# Patient Record
Sex: Male | Born: 1955
Health system: Southern US, Community
[De-identification: ages and names within clinical notes are randomized; demographics above are authoritative.]

## PROBLEM LIST (undated history)

## (undated) DIAGNOSIS — F32A Depression, unspecified: Secondary | ICD-10-CM

## (undated) DIAGNOSIS — I10 Essential (primary) hypertension: Secondary | ICD-10-CM

## (undated) DIAGNOSIS — F329 Major depressive disorder, single episode, unspecified: Secondary | ICD-10-CM

## (undated) HISTORY — DX: Depression, unspecified: F32.A

## (undated) HISTORY — DX: Essential (primary) hypertension: I10

## (undated) HISTORY — PX: TONSILLECTOMY: SUR1361

## (undated) HISTORY — DX: Major depressive disorder, single episode, unspecified: F32.9

---

## 2004-02-29 ENCOUNTER — Emergency Department: Payer: Self-pay | Admitting: Emergency Medicine

## 2004-02-29 ENCOUNTER — Other Ambulatory Visit: Payer: Self-pay

## 2005-03-06 ENCOUNTER — Ambulatory Visit: Payer: Self-pay | Admitting: Family Medicine

## 2005-03-08 ENCOUNTER — Ambulatory Visit: Payer: Self-pay | Admitting: Family Medicine

## 2006-04-08 ENCOUNTER — Ambulatory Visit: Payer: Self-pay | Admitting: Family Medicine

## 2006-05-08 ENCOUNTER — Ambulatory Visit: Payer: Self-pay | Admitting: Gastroenterology

## 2006-10-07 IMAGING — CT CT CERVICAL SPINE WITHOUT CONTRAST
1 of 2 series · 9 of 14 positions shown, 12 images · non-contrast
Comparison: none

REASON FOR EXAM: Eval Occult Fx
COMMENTS:

[Series 3: inspace · axial · 0.39mm/px · z∈[-229,-68]mm · 9 of 288 slices shown, 12 images]
[im 29/288  soft-tissue]
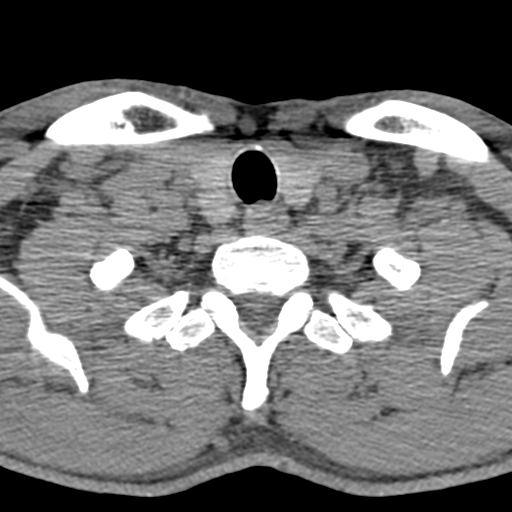
[im 29/288  bone]
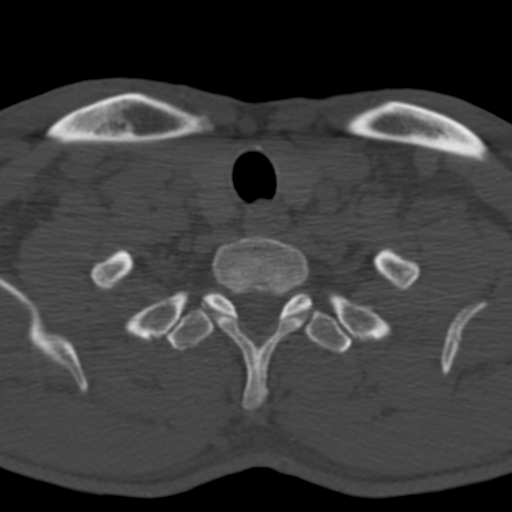
[im 58/288  bone]
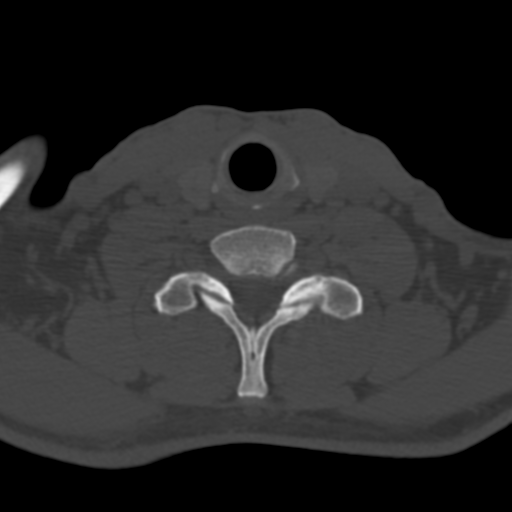
[im 87/288  bone]
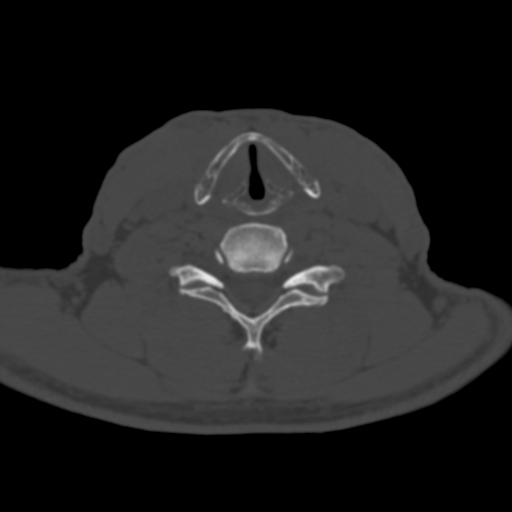
[im 115/288  bone]
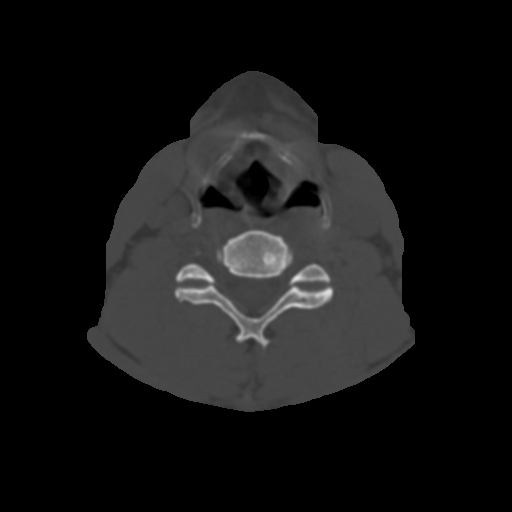
[im 144/288  soft-tissue]
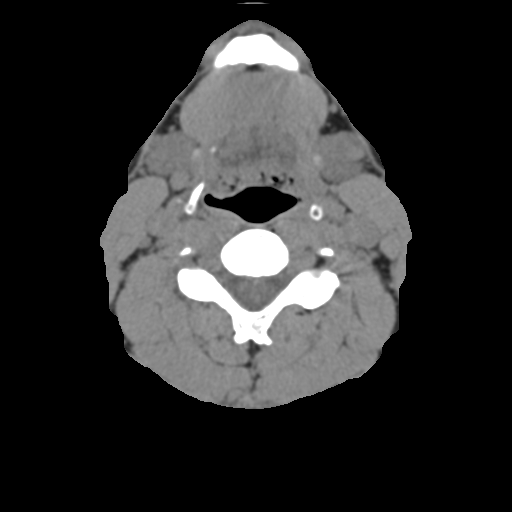
[im 144/288  bone]
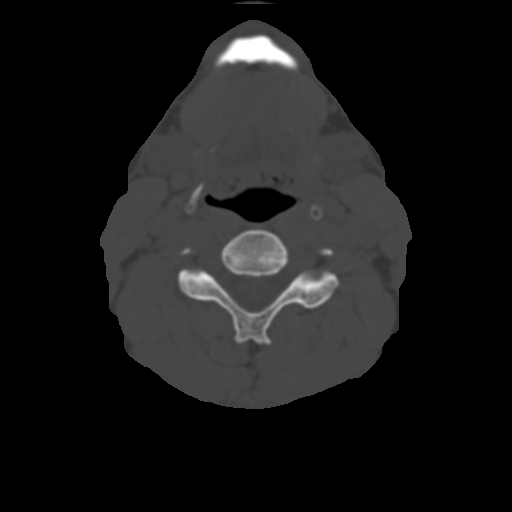
[im 173/288  bone]
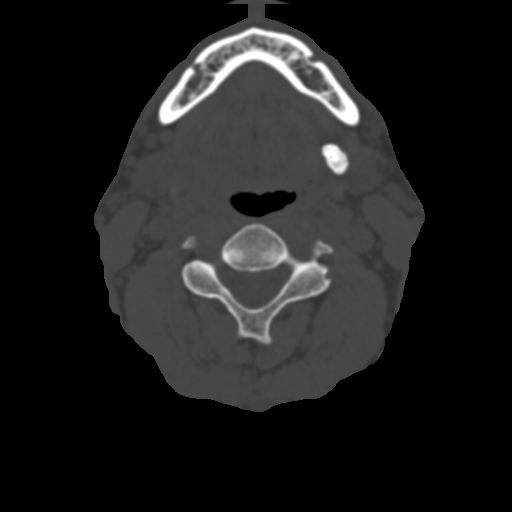
[im 201/288  bone]
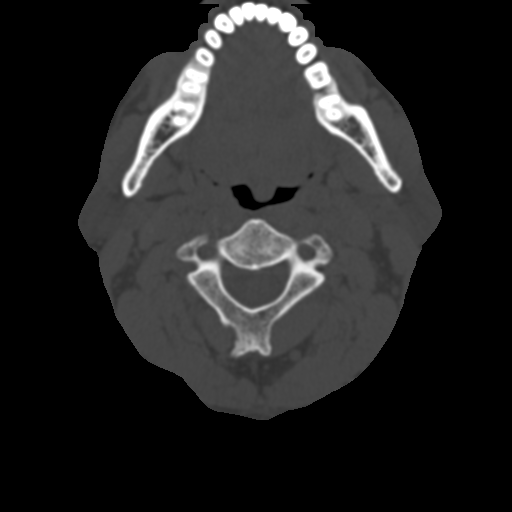
[im 230/288  bone]
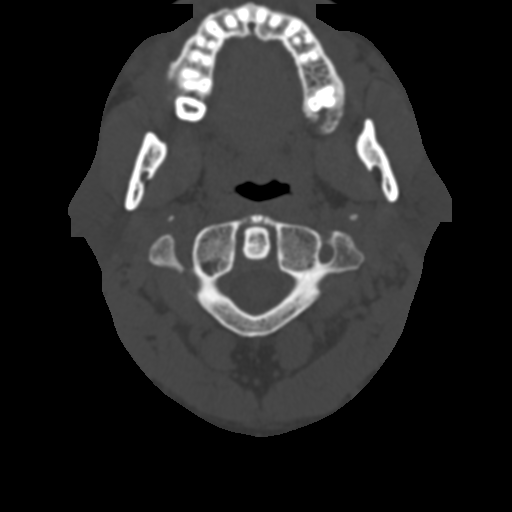
[im 259/288  soft-tissue]
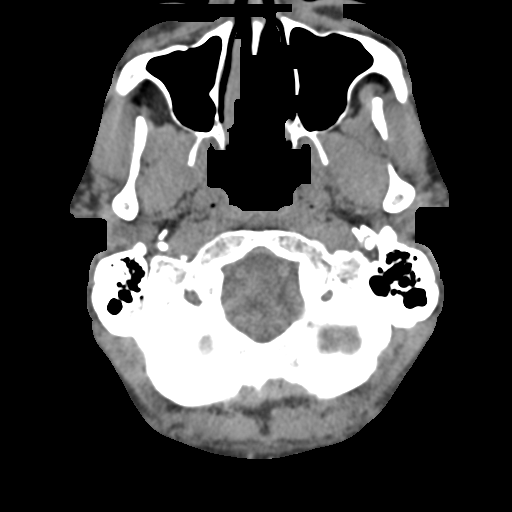
[im 259/288  bone]
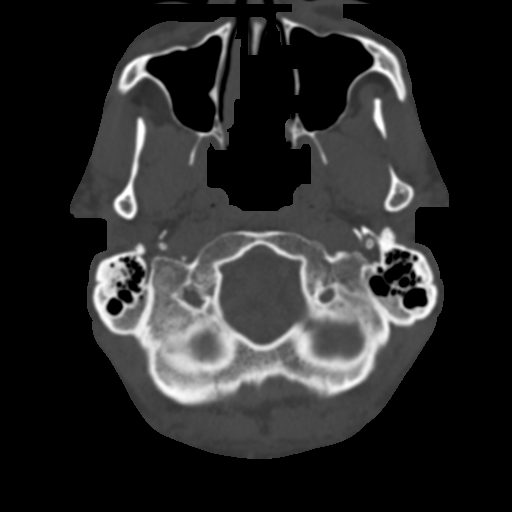

[9 of 14 positions shown; findings below may reference images not displayed]

PROCEDURE:     CT  - CT CERVICAL SPINE WO  - March 08, 2005  [DATE]

RESULT:     Axial, coronal and sagittal reconstructions were performed. No
fracture is identified. There are some degenerative changes present. There
is no evidence of subluxation.  There is no widening of the facets. Should
the patient's symptoms persist it may be reasonable to consider the patient
for MRI.  The prevertebral soft tissues appear to be normal.  No loss of
height is seen in the vertebral body.  The cervicobasilar relationships
appear to be maintained. The C1-C2 alignment is normal and the odontoid is
intact.
IMPRESSION: 1)No CT evidence of an occult cervical spine fracture.

## 2012-04-23 HISTORY — PX: COLONOSCOPY: SHX174

## 2012-12-18 ENCOUNTER — Ambulatory Visit: Payer: Self-pay | Admitting: Gastroenterology

## 2013-06-25 ENCOUNTER — Emergency Department: Payer: Self-pay | Admitting: Emergency Medicine

## 2014-10-04 ENCOUNTER — Other Ambulatory Visit: Payer: Self-pay | Admitting: Family Medicine

## 2014-10-04 DIAGNOSIS — F32A Depression, unspecified: Secondary | ICD-10-CM

## 2014-10-04 DIAGNOSIS — F329 Major depressive disorder, single episode, unspecified: Secondary | ICD-10-CM

## 2014-11-02 ENCOUNTER — Ambulatory Visit (INDEPENDENT_AMBULATORY_CARE_PROVIDER_SITE_OTHER): Payer: Managed Care, Other (non HMO) | Admitting: Family Medicine

## 2014-11-02 ENCOUNTER — Encounter: Payer: Self-pay | Admitting: Family Medicine

## 2014-11-02 VITALS — BP 130/70 | HR 64 | Ht 70.0 in | Wt 170.0 lb

## 2014-11-02 DIAGNOSIS — E785 Hyperlipidemia, unspecified: Secondary | ICD-10-CM

## 2014-11-02 DIAGNOSIS — F32A Depression, unspecified: Secondary | ICD-10-CM

## 2014-11-02 DIAGNOSIS — I10 Essential (primary) hypertension: Secondary | ICD-10-CM | POA: Diagnosis not present

## 2014-11-02 DIAGNOSIS — F329 Major depressive disorder, single episode, unspecified: Secondary | ICD-10-CM | POA: Diagnosis not present

## 2014-11-02 MED ORDER — SERTRALINE HCL 50 MG PO TABS: 50.0000 mg | ORAL_TABLET | Freq: Every day | ORAL | Status: DC

## 2014-11-02 MED ORDER — LISINOPRIL-HYDROCHLOROTHIAZIDE 10-12.5 MG PO TABS: 1.0000 | ORAL_TABLET | Freq: Every day | ORAL | Status: DC

## 2014-11-02 NOTE — Progress Notes (Signed)
Name: Jordan Morton   MRN: [id number]    DOB: 05/01/1955   Date:11/02/2014       Progress Note  Subjective  Chief Complaint  Chief Complaint  Patient presents with  . Hypertension  . Depression    Hypertension This is a chronic problem. The current episode started more than 1 year ago. The problem has been gradually improving since onset. Pertinent negatives include no anxiety, blurred vision, chest pain, headaches, malaise/fatigue, neck pain, orthopnea, palpitations, peripheral edema, PND, shortness of breath or sweats. There are no associated agents to hypertension. Risk factors for coronary artery disease include male gender and smoking/tobacco exposure. Past treatments include ACE inhibitors and diuretics. The current treatment provides moderate improvement. There are no compliance problems.  There is no history of angina, kidney disease, CAD/MI, CVA, heart failure, left ventricular hypertrophy, PVD or retinopathy. There is no history of chronic renal disease.  Mental Health Problem The primary symptoms include dysphoric mood. The primary symptoms do not include delusions, hallucinations, bizarre behavior, disorganized speech, negative symptoms or somatic symptoms. The current episode started more than 1 month ago.  The mood has been improving since its onset. He characterizes the problem as mild. The mood includes feelings of sadness.  The degree of incapacity that he is experiencing as a consequence of his illness is mild. Additional symptoms of the illness include appetite change. Additional symptoms of the illness do not include no anhedonia, no insomnia, no hypersomnia, no unexpected weight change, no feelings of worthlessness, no euphoric mood, no decreased need for sleep, no poor judgment, no headaches or no abdominal pain. He does not admit to suicidal ideas.    No problem-specific assessment & plan notes found for this encounter.   Past Medical History  Diagnosis Date  .  Hypertension   . Depression     Past Surgical History  Procedure Laterality Date  . Colonoscopy  2014    repeat in 5 years  . Tonsillectomy      Family History  Problem Relation Age of Onset  . Cancer Mother   . Cancer Father     History   Social History  . Marital Status: Married    Spouse Name: N/A  . Number of Children: N/A  . Years of Education: N/A   Occupational History  . Not on file.   Social History Main Topics  . Smoking status: Current Every Day Smoker  . Smokeless tobacco: Not on file  . Alcohol Use: 0.0 oz/week    0 Standard drinks or equivalent per week  . Drug Use: No  . Sexual Activity: Yes   Other Topics Concern  . Not on file   Social History Narrative  . No narrative on file    No Known Allergies   Review of Systems  Constitutional: Positive for appetite change. Negative for fever, chills, weight loss, malaise/fatigue and unexpected weight change.  HENT: Negative for ear discharge, ear pain and sore throat.   Eyes: Negative for blurred vision.  Respiratory: Negative for cough, sputum production, shortness of breath and wheezing.   Cardiovascular: Negative for chest pain, palpitations, orthopnea, leg swelling and PND.  Gastrointestinal: Negative for heartburn, nausea, abdominal pain, diarrhea, constipation, blood in stool and melena.  Genitourinary: Negative for dysuria, urgency, frequency and hematuria.  Musculoskeletal: Negative for myalgias, back pain, joint pain and neck pain.  Skin: Negative for rash.  Neurological: Negative for dizziness, tingling, sensory change, focal weakness and headaches.  Endo/Heme/Allergies: Negative for environmental allergies and  polydipsia. Does not bruise/bleed easily.  Psychiatric/Behavioral: Positive for dysphoric mood. Negative for depression, suicidal ideas and hallucinations. The patient is not nervous/anxious and does not have insomnia.      Objective  Filed Vitals:   11/02/14 0809  BP: 130/70   Pulse: 64  Height: 5\' 10"  (1.778 m)  Weight: 170 lb (77.111 kg)    Physical Exam  Constitutional: He is oriented to person, place, and time and well-developed, well-nourished, and in no distress.  HENT:  Head: Normocephalic.  Right Ear: External ear normal.  Left Ear: External ear normal.  Nose: Nose normal.  Mouth/Throat: Oropharynx is clear and moist.  Eyes: Conjunctivae and EOM are normal. Pupils are equal, round, and reactive to light. Right eye exhibits no discharge. Left eye exhibits no discharge. No scleral icterus.  Neck: Normal range of motion. Neck supple. No JVD present. No tracheal deviation present. No thyromegaly present.  Cardiovascular: Normal rate, regular rhythm, normal heart sounds and intact distal pulses.  Exam reveals no gallop and no friction rub.   No murmur heard. Pulmonary/Chest: Breath sounds normal. No respiratory distress. He has no wheezes. He has no rales.  Abdominal: Soft. Bowel sounds are normal. He exhibits no mass. There is no hepatosplenomegaly. There is no tenderness. There is no rebound, no guarding and no CVA tenderness.  Musculoskeletal: Normal range of motion. He exhibits no edema or tenderness.  Lymphadenopathy:    He has no cervical adenopathy.  Neurological: He is alert and oriented to person, place, and time. He has normal sensation, normal strength, normal reflexes and intact cranial nerves. No cranial nerve deficit.  Skin: Skin is warm. No rash noted.  Psychiatric: Mood and affect normal.  Nursing note and vitals reviewed.     Assessment & Plan  Problem List Items Addressed This Visit    None    Visit Diagnoses    Essential hypertension    -  Primary    Relevant Medications    aspirin EC 81 MG tablet    lisinopril-hydrochlorothiazide (PRINZIDE,ZESTORETIC) 10-12.5 MG per tablet    Other Relevant Orders    Renal Function Panel    Depression        Relevant Medications    sertraline (ZOLOFT) 50 MG tablet    Dyslipidemia         Relevant Orders    Lipid Profile         Dr. Otilio Miu [facility]  11/02/2014

## 2014-11-03 LAB — LIPID PANEL
CHOL/HDL RATIO: 2.7 ratio (ref 0.0–5.0)
CHOLESTEROL TOTAL: 165 mg/dL (ref 100–199)
HDL: 61 mg/dL (ref >39)
LDL Calculated: 90 mg/dL (ref 0–99)
Triglycerides: 69 mg/dL (ref 0–149)
VLDL Cholesterol Cal: 14 mg/dL (ref 5–40)

## 2014-11-03 LAB — RENAL FUNCTION PANEL: Phosphorus: 2.7 mg/dL (ref 2.5–4.5)

## 2015-02-14 ENCOUNTER — Ambulatory Visit (INDEPENDENT_AMBULATORY_CARE_PROVIDER_SITE_OTHER): Payer: Managed Care, Other (non HMO) | Admitting: Family Medicine

## 2015-02-14 ENCOUNTER — Encounter: Payer: Self-pay | Admitting: Family Medicine

## 2015-02-14 VITALS — BP 130/80 | HR 80 | Ht 70.0 in | Wt 172.0 lb

## 2015-02-14 DIAGNOSIS — J0110 Acute frontal sinusitis, unspecified: Secondary | ICD-10-CM | POA: Diagnosis not present

## 2015-02-14 MED ORDER — AMOXICILLIN 500 MG PO CAPS: 500.0000 mg | ORAL_CAPSULE | Freq: Three times a day (TID) | ORAL | Status: DC

## 2015-02-14 NOTE — Progress Notes (Signed)
Name: Jordan Morton   MRN: [id number]    DOB: Aug 30, 1955   Date:02/14/2015       Progress Note  Subjective  Chief Complaint  Chief Complaint  Patient presents with  . Sinusitis    tried otc sinus med    Sinusitis This is a new problem. The current episode started in the past 7 days. The problem has been gradually worsening since onset. There has been no fever. His pain is at a severity of 4/10. Associated symptoms include chills, congestion, diaphoresis, headaches, sinus pressure, a sore throat and swollen glands. Pertinent negatives include no coughing, ear pain, neck pain or shortness of breath. Past treatments include oral decongestants. The treatment provided no relief.    No problem-specific assessment & plan notes found for this encounter.   Past Medical History  Diagnosis Date  . Hypertension   . Depression     Past Surgical History  Procedure Laterality Date  . Colonoscopy  2014    repeat in 5 years  . Tonsillectomy      Family History  Problem Relation Age of Onset  . Cancer Mother   . Cancer Father     Social History   Social History  . Marital Status: Married    Spouse Name: N/A  . Number of Children: N/A  . Years of Education: N/A   Occupational History  . Not on file.   Social History Main Topics  . Smoking status: Former Research scientist (life sciences)  . Smokeless tobacco: Current User    Types: Snuff  . Alcohol Use: 0.0 oz/week    0 Standard drinks or equivalent per week  . Drug Use: No  . Sexual Activity: Yes   Other Topics Concern  . Not on file   Social History Narrative    No Known Allergies   Review of Systems  Constitutional: Positive for chills and diaphoresis. Negative for fever, weight loss and malaise/fatigue.  HENT: Positive for congestion, sinus pressure and sore throat. Negative for ear discharge, ear pain, hearing loss, nosebleeds and tinnitus.   Eyes: Negative for blurred vision.  Respiratory: Negative for cough, sputum production,  shortness of breath and wheezing.   Cardiovascular: Negative for chest pain, palpitations and leg swelling.  Gastrointestinal: Negative for heartburn, nausea, abdominal pain, diarrhea, constipation, blood in stool and melena.  Genitourinary: Negative for dysuria, urgency, frequency and hematuria.  Musculoskeletal: Negative for myalgias, back pain, joint pain and neck pain.  Skin: Negative for rash.  Neurological: Positive for headaches. Negative for dizziness, tingling, sensory change and focal weakness.  Endo/Heme/Allergies: Negative for environmental allergies and polydipsia. Does not bruise/bleed easily.  Psychiatric/Behavioral: Negative for depression and suicidal ideas. The patient is not nervous/anxious and does not have insomnia.      Objective  Filed Vitals:   02/14/15 0749  BP: 130/80  Pulse: 80  Height: 5\' 10"  (1.778 m)  Weight: 172 lb (78.019 kg)    Physical Exam  Constitutional: He is oriented to person, place, and time and well-developed, well-nourished, and in no distress.  HENT:  Head: Normocephalic.  Right Ear: External ear normal. A foreign body is present.  Left Ear: External ear and ear canal normal. A middle ear effusion is present.  Nose: Nose normal.  Mouth/Throat: Oropharynx is clear and moist.  Eyes: Conjunctivae and EOM are normal. Pupils are equal, round, and reactive to light. Right eye exhibits no discharge. Left eye exhibits no discharge. No scleral icterus.  Neck: Normal range of motion. Neck supple. No JVD  present. No tracheal deviation present. No thyromegaly present.  Cardiovascular: Normal rate, regular rhythm, normal heart sounds and intact distal pulses.  Exam reveals no gallop and no friction rub.   No murmur heard. Pulmonary/Chest: Breath sounds normal. No respiratory distress. He has no wheezes. He has no rales.  Abdominal: Soft. Bowel sounds are normal. He exhibits no mass. There is no hepatosplenomegaly. There is no tenderness. There is no  rebound, no guarding and no CVA tenderness.  Musculoskeletal: Normal range of motion. He exhibits no edema or tenderness.  Lymphadenopathy:    He has no cervical adenopathy.  Neurological: He is alert and oriented to person, place, and time. He has normal sensation, normal strength, normal reflexes and intact cranial nerves. No cranial nerve deficit.  Skin: Skin is warm. No rash noted.  Psychiatric: Mood and affect normal.      Assessment & Plan  Problem List Items Addressed This Visit    None    Visit Diagnoses    Acute frontal sinusitis, recurrence not specified    -  Primary         Dr. Otilio Miu [facility]  02/14/2015

## 2015-07-21 ENCOUNTER — Other Ambulatory Visit: Payer: Self-pay

## 2015-07-21 DIAGNOSIS — Z716 Tobacco abuse counseling: Secondary | ICD-10-CM

## 2015-07-21 MED ORDER — VARENICLINE TARTRATE 0.5 MG PO TABS: 0.5000 mg | ORAL_TABLET | Freq: Two times a day (BID) | ORAL | Status: DC

## 2015-08-03 ENCOUNTER — Other Ambulatory Visit: Payer: Self-pay | Admitting: Family Medicine

## 2015-08-08 ENCOUNTER — Ambulatory Visit (INDEPENDENT_AMBULATORY_CARE_PROVIDER_SITE_OTHER): Payer: Managed Care, Other (non HMO) | Admitting: Family Medicine

## 2015-08-08 ENCOUNTER — Encounter: Payer: Self-pay | Admitting: Family Medicine

## 2015-08-08 VITALS — BP 122/80 | HR 88 | Ht 70.0 in | Wt 185.0 lb

## 2015-08-08 DIAGNOSIS — F329 Major depressive disorder, single episode, unspecified: Secondary | ICD-10-CM | POA: Diagnosis not present

## 2015-08-08 DIAGNOSIS — I10 Essential (primary) hypertension: Secondary | ICD-10-CM | POA: Diagnosis not present

## 2015-08-08 DIAGNOSIS — F32A Depression, unspecified: Secondary | ICD-10-CM

## 2015-08-08 MED ORDER — SERTRALINE HCL 50 MG PO TABS: ORAL_TABLET | ORAL | Status: DC

## 2015-08-08 MED ORDER — LISINOPRIL-HYDROCHLOROTHIAZIDE 10-12.5 MG PO TABS: 1.0000 | ORAL_TABLET | Freq: Every day | ORAL | Status: DC

## 2015-08-08 NOTE — Progress Notes (Signed)
Name: Jordan Morton   MRN: [id number]    DOB: 1955/10/05   Date:08/08/2015       Progress Note  Subjective  Chief Complaint  Chief Complaint  Patient presents with  . Hypertension  . Depression    Hypertension This is a chronic problem. The current episode started more than 1 year ago. The problem has been gradually improving since onset. The problem is controlled. Pertinent negatives include no anxiety, blurred vision, chest pain, headaches, malaise/fatigue, neck pain, orthopnea, palpitations, peripheral edema, PND, shortness of breath or sweats. There are no associated agents to hypertension. Risk factors for coronary artery disease include dyslipidemia, smoking/tobacco exposure and post-menopausal state. Past treatments include ACE inhibitors and diuretics. The current treatment provides moderate improvement. There are no compliance problems.  There is no history of angina, kidney disease, CAD/MI, CVA, heart failure, left ventricular hypertrophy, PVD, renovascular disease or retinopathy. There is no history of chronic renal disease or a hypertension causing med.  Depression        This is a chronic problem.  The current episode started more than 1 year ago.   The onset quality is gradual.   The problem occurs intermittently.  The problem has been gradually improving since onset.  Associated symptoms include does not have insomnia, no myalgias, no headaches and no suicidal ideas.     The symptoms are aggravated by nothing.  Past treatments include SSRIs - Selective serotonin reuptake inhibitors.  Compliance with treatment is good.  Previous treatment provided mild relief.   Pertinent negatives include no chronic fatigue syndrome, no terminal illness, no anxiety and no eating disorder.   No problem-specific assessment & plan notes found for this encounter.   Past Medical History  Diagnosis Date  . Hypertension   . Depression     Past Surgical History  Procedure Laterality Date  .  Colonoscopy  2014    repeat in 5 years  . Tonsillectomy      Family History  Problem Relation Age of Onset  . Cancer Mother   . Cancer Father     Social History   Social History  . Marital Status: Married    Spouse Name: N/A  . Number of Children: N/A  . Years of Education: N/A   Occupational History  . Not on file.   Social History Main Topics  . Smoking status: Former Research scientist (life sciences)  . Smokeless tobacco: Current User    Types: Snuff  . Alcohol Use: 0.0 oz/week    0 Standard drinks or equivalent per week  . Drug Use: No  . Sexual Activity: Yes   Other Topics Concern  . Not on file   Social History Narrative    No Known Allergies   Review of Systems  Constitutional: Negative for fever, chills, weight loss and malaise/fatigue.  HENT: Negative for ear discharge, ear pain and sore throat.   Eyes: Negative for blurred vision.  Respiratory: Negative for cough, sputum production, shortness of breath and wheezing.   Cardiovascular: Negative for chest pain, palpitations, orthopnea, leg swelling and PND.  Gastrointestinal: Negative for heartburn, nausea, abdominal pain, diarrhea, constipation, blood in stool and melena.  Genitourinary: Negative for dysuria, urgency, frequency and hematuria.  Musculoskeletal: Negative for myalgias, back pain, joint pain and neck pain.  Skin: Negative for rash.  Neurological: Negative for dizziness, tingling, sensory change, focal weakness and headaches.  Endo/Heme/Allergies: Negative for environmental allergies and polydipsia. Does not bruise/bleed easily.  Psychiatric/Behavioral: Positive for depression. Negative for suicidal ideas. The  patient is not nervous/anxious and does not have insomnia.      Objective  Filed Vitals:   08/08/15 0853  BP: 122/80  Pulse: 88  Height: 5\' 10"  (1.778 m)  Weight: 185 lb (83.915 kg)    Physical Exam  Constitutional: He is oriented to person, place, and time and well-developed, well-nourished, and in  no distress.  HENT:  Head: Normocephalic.  Right Ear: External ear normal.  Left Ear: External ear normal.  Nose: Nose normal.  Mouth/Throat: Oropharynx is clear and moist.  Eyes: Conjunctivae and EOM are normal. Pupils are equal, round, and reactive to light. Right eye exhibits no discharge. Left eye exhibits no discharge. No scleral icterus.  Neck: Normal range of motion. Neck supple. No JVD present. No tracheal deviation present. No thyromegaly present.  Cardiovascular: Normal rate, regular rhythm, normal heart sounds and intact distal pulses.  Exam reveals no gallop and no friction rub.   No murmur heard. Pulmonary/Chest: Breath sounds normal. No respiratory distress. He has no wheezes. He has no rales.  Abdominal: Soft. Bowel sounds are normal. He exhibits no mass. There is no hepatosplenomegaly. There is no tenderness. There is no rebound, no guarding and no CVA tenderness.  Musculoskeletal: Normal range of motion. He exhibits no edema or tenderness.  Lymphadenopathy:    He has no cervical adenopathy.  Neurological: He is alert and oriented to person, place, and time. He has normal sensation, normal strength, normal reflexes and intact cranial nerves. No cranial nerve deficit.  Skin: Skin is warm. No rash noted.  Psychiatric: Mood and affect normal.  Nursing note and vitals reviewed.     Assessment & Plan  Problem List Items Addressed This Visit    None    Visit Diagnoses    Essential hypertension    -  Primary    Relevant Medications    lisinopril-hydrochlorothiazide (PRINZIDE,ZESTORETIC) 10-12.5 MG tablet    Other Relevant Orders    Renal function panel    Depression        Relevant Medications    sertraline (ZOLOFT) 50 MG tablet         Dr. Maximo Spratling [facility]  08/08/2015

## 2015-08-09 LAB — RENAL FUNCTION PANEL
Albumin: 5 g/dL (ref 3.5–5.5)
BUN / CREAT RATIO: 14 (ref 9–20)
BUN: 11 mg/dL (ref 6–24)
CALCIUM: 9.8 mg/dL (ref 8.7–10.2)
CHLORIDE: 96 mmol/L (ref 96–106)
CO2: 26 mmol/L (ref 18–29)
Creatinine, Ser: 0.8 mg/dL (ref 0.76–1.27)
GFR calc Af Amer: 114 mL/min/{1.73_m2} (ref >59)
GFR calc non Af Amer: 98 mL/min/{1.73_m2} (ref >59)
Glucose: 89 mg/dL (ref 65–99)
PHOSPHORUS: 3.1 mg/dL (ref 2.5–4.5)
Potassium: 4.9 mmol/L (ref 3.5–5.2)
Sodium: 139 mmol/L (ref 134–144)

## 2015-08-23 ENCOUNTER — Other Ambulatory Visit: Payer: Self-pay | Admitting: Family Medicine

## 2015-12-03 ENCOUNTER — Other Ambulatory Visit: Payer: Self-pay | Admitting: Family Medicine

## 2016-05-17 ENCOUNTER — Other Ambulatory Visit: Payer: Self-pay

## 2016-05-17 DIAGNOSIS — F331 Major depressive disorder, recurrent, moderate: Secondary | ICD-10-CM

## 2016-05-17 DIAGNOSIS — I10 Essential (primary) hypertension: Secondary | ICD-10-CM

## 2016-05-17 MED ORDER — LISINOPRIL-HYDROCHLOROTHIAZIDE 10-12.5 MG PO TABS: 1.0000 | ORAL_TABLET | Freq: Every day | ORAL | 0 refills | Status: DC

## 2016-05-17 MED ORDER — SERTRALINE HCL 50 MG PO TABS: ORAL_TABLET | ORAL | 0 refills | Status: DC

## 2016-05-22 ENCOUNTER — Ambulatory Visit (INDEPENDENT_AMBULATORY_CARE_PROVIDER_SITE_OTHER): Payer: Commercial Managed Care - PPO | Admitting: Family Medicine

## 2016-05-22 ENCOUNTER — Encounter: Payer: Self-pay | Admitting: Family Medicine

## 2016-05-22 VITALS — BP 110/80 | HR 80 | Ht 70.0 in | Wt 172.0 lb

## 2016-05-22 DIAGNOSIS — I10 Essential (primary) hypertension: Secondary | ICD-10-CM

## 2016-05-22 DIAGNOSIS — N411 Chronic prostatitis: Secondary | ICD-10-CM

## 2016-05-22 DIAGNOSIS — E785 Hyperlipidemia, unspecified: Secondary | ICD-10-CM | POA: Diagnosis not present

## 2016-05-22 DIAGNOSIS — F320 Major depressive disorder, single episode, mild: Secondary | ICD-10-CM | POA: Diagnosis not present

## 2016-05-22 DIAGNOSIS — F331 Major depressive disorder, recurrent, moderate: Secondary | ICD-10-CM

## 2016-05-22 DIAGNOSIS — R351 Nocturia: Secondary | ICD-10-CM

## 2016-05-22 DIAGNOSIS — Z0000 Encounter for general adult medical examination without abnormal findings: Secondary | ICD-10-CM

## 2016-05-22 LAB — HEMOCCULT GUIAC POC 1CARD (OFFICE): FECAL OCCULT BLD: NEGATIVE

## 2016-05-22 MED ORDER — SULFAMETHOXAZOLE-TRIMETHOPRIM 800-160 MG PO TABS: 1.0000 | ORAL_TABLET | Freq: Two times a day (BID) | ORAL | 0 refills | Status: DC

## 2016-05-22 MED ORDER — LISINOPRIL-HYDROCHLOROTHIAZIDE 10-12.5 MG PO TABS: 1.0000 | ORAL_TABLET | Freq: Every day | ORAL | 1 refills | Status: DC

## 2016-05-22 MED ORDER — SERTRALINE HCL 50 MG PO TABS: ORAL_TABLET | ORAL | 1 refills | Status: DC
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
## 2016-05-23 LAB — RENAL FUNCTION PANEL
ALBUMIN: 4.8 g/dL (ref 3.5–5.5)
BUN/Creatinine Ratio: 12 (ref 9–20)
BUN: 9 mg/dL (ref 6–24)
CALCIUM: 9.8 mg/dL (ref 8.7–10.2)
CO2: 28 mmol/L (ref 18–29)
CREATININE: 0.76 mg/dL (ref 0.76–1.27)
Chloride: 95 mmol/L — ABNORMAL LOW (ref 96–106)
GFR calc Af Amer: 115 mL/min/{1.73_m2} (ref >59)
GFR calc non Af Amer: 100 mL/min/{1.73_m2} (ref >59)
GLUCOSE: 91 mg/dL (ref 65–99)
PHOSPHORUS: 3.2 mg/dL (ref 2.5–4.5)
POTASSIUM: 4.6 mmol/L (ref 3.5–5.2)
SODIUM: 137 mmol/L (ref 134–144)

## 2016-05-23 LAB — LIPID PANEL
CHOL/HDL RATIO: 3.3 ratio (ref 0.0–5.0)
Cholesterol, Total: 195 mg/dL (ref 100–199)
HDL: 60 mg/dL (ref >39)
LDL Calculated: 122 mg/dL — ABNORMAL HIGH (ref 0–99)
TRIGLYCERIDES: 66 mg/dL (ref 0–149)
VLDL Cholesterol Cal: 13 mg/dL (ref 5–40)

## 2016-05-23 LAB — PSA: PROSTATE SPECIFIC AG, SERUM: 1.3 ng/mL (ref 0.0–4.0)

## 2016-06-19 ENCOUNTER — Ambulatory Visit: Payer: Managed Care, Other (non HMO) | Admitting: Family Medicine

## 2016-08-21 ENCOUNTER — Other Ambulatory Visit: Payer: Self-pay | Admitting: Family Medicine

## 2016-08-23 ENCOUNTER — Other Ambulatory Visit: Payer: Self-pay | Admitting: Family Medicine

## 2016-10-27 ENCOUNTER — Other Ambulatory Visit: Payer: Self-pay | Admitting: Family Medicine

## 2017-02-13 ENCOUNTER — Other Ambulatory Visit: Payer: Self-pay | Admitting: Family Medicine

## 2017-02-13 DIAGNOSIS — I10 Essential (primary) hypertension: Secondary | ICD-10-CM

## 2017-02-13 DIAGNOSIS — F331 Major depressive disorder, recurrent, moderate: Secondary | ICD-10-CM

## 2017-04-19 ENCOUNTER — Other Ambulatory Visit: Payer: Self-pay

## 2017-08-17 ENCOUNTER — Other Ambulatory Visit: Payer: Self-pay | Admitting: Family Medicine

## 2017-08-17 DIAGNOSIS — I10 Essential (primary) hypertension: Secondary | ICD-10-CM

## 2017-08-17 DIAGNOSIS — F331 Major depressive disorder, recurrent, moderate: Secondary | ICD-10-CM

## 2018-02-17 ENCOUNTER — Other Ambulatory Visit: Payer: Self-pay | Admitting: Family Medicine

## 2018-02-17 DIAGNOSIS — F331 Major depressive disorder, recurrent, moderate: Secondary | ICD-10-CM

## 2018-02-17 DIAGNOSIS — I10 Essential (primary) hypertension: Secondary | ICD-10-CM

## 2018-03-15 ENCOUNTER — Other Ambulatory Visit: Payer: Self-pay | Admitting: Family Medicine

## 2018-03-15 DIAGNOSIS — I10 Essential (primary) hypertension: Secondary | ICD-10-CM

## 2018-03-15 DIAGNOSIS — F331 Major depressive disorder, recurrent, moderate: Secondary | ICD-10-CM

## 2018-03-16 ENCOUNTER — Other Ambulatory Visit: Payer: Self-pay | Admitting: Family Medicine

## 2018-03-16 DIAGNOSIS — F331 Major depressive disorder, recurrent, moderate: Secondary | ICD-10-CM

## 2018-04-02 ENCOUNTER — Ambulatory Visit: Payer: Commercial Managed Care - PPO | Admitting: Family Medicine

## 2018-04-02 ENCOUNTER — Telehealth: Payer: Self-pay

## 2018-04-02 ENCOUNTER — Encounter: Payer: Self-pay | Admitting: Family Medicine

## 2018-04-02 VITALS — BP 140/62 | HR 80 | Ht 70.0 in | Wt 175.0 lb

## 2018-04-02 DIAGNOSIS — F331 Major depressive disorder, recurrent, moderate: Secondary | ICD-10-CM | POA: Diagnosis not present

## 2018-04-02 DIAGNOSIS — R351 Nocturia: Secondary | ICD-10-CM

## 2018-04-02 DIAGNOSIS — I10 Essential (primary) hypertension: Secondary | ICD-10-CM | POA: Diagnosis not present

## 2018-04-02 DIAGNOSIS — E785 Hyperlipidemia, unspecified: Secondary | ICD-10-CM | POA: Diagnosis not present

## 2018-04-02 DIAGNOSIS — F17200 Nicotine dependence, unspecified, uncomplicated: Secondary | ICD-10-CM | POA: Diagnosis not present

## 2018-04-02 DIAGNOSIS — Z1211 Encounter for screening for malignant neoplasm of colon: Secondary | ICD-10-CM

## 2018-04-02 MED ORDER — SERTRALINE HCL 50 MG PO TABS: 50.0000 mg | ORAL_TABLET | Freq: Every day | ORAL | 5 refills | Status: DC

## 2018-04-02 MED ORDER — LISINOPRIL-HYDROCHLOROTHIAZIDE 10-12.5 MG PO TABS: 1.0000 | ORAL_TABLET | Freq: Every day | ORAL | 5 refills | Status: DC

## 2018-04-02 MED ORDER — NICOTINE 21 MG/24HR TD PT24: 21.0000 mg | MEDICATED_PATCH | Freq: Every day | TRANSDERMAL | 0 refills | Status: DC

## 2018-04-02 NOTE — Progress Notes (Signed)
Date:  04/02/2018   Name:  Jordan Morton   DOB:  02-11-1956   MRN:  [id number]   Chief Complaint: Hypertension; Depression; and Colonoscopy (Dr Allen Norris- 10 years)  Hypertension  This is a chronic problem. The current episode started more than 1 year ago. The problem is unchanged. The problem is controlled. Pertinent negatives include no anxiety, blurred vision, chest pain, headaches, malaise/fatigue, neck pain, orthopnea, palpitations, peripheral edema, PND, shortness of breath or sweats. There are no associated agents to hypertension. There are no known risk factors for coronary artery disease. Past treatments include ACE inhibitors and diuretics. The current treatment provides mild improvement. There are no compliance problems.  There is no history of angina, kidney disease, CAD/MI, CVA, heart failure, left ventricular hypertrophy, PVD or retinopathy. There is no history of chronic renal disease, a hypertension causing med or renovascular disease.  Depression         This is a chronic problem.  The current episode started more than 1 year ago.   The onset quality is sudden.   The problem has been gradually improving since onset.  Associated symptoms include no decreased concentration, no fatigue, no helplessness, no hopelessness, does not have insomnia, not irritable, no restlessness, no decreased interest, no appetite change, no body aches, no myalgias, no headaches, no indigestion, not sad and no suicidal ideas.     The symptoms are aggravated by work stress (improved stress).  Past treatments include SSRIs - Selective serotonin reuptake inhibitors.  Compliance with treatment is good.  Past compliance problems include difficulty understanding directions.  Previous treatment provided no relief relief.   Pertinent negatives include no hypothyroidism and no anxiety. Hyperlipidemia  This is a chronic problem. The current episode started more than 1 year ago. The problem is controlled. Recent lipid  tests were reviewed and are normal. He has no history of chronic renal disease, diabetes, hypothyroidism or obesity. There are no known factors aggravating his hyperlipidemia. Pertinent negatives include no chest pain, myalgias or shortness of breath. Current antihyperlipidemic treatment includes diet change. The current treatment provides mild improvement of lipids. There are no compliance problems.  Risk factors for coronary artery disease include dyslipidemia and male sex.    Review of Systems  Constitutional: Negative for appetite change, chills, fatigue, fever and malaise/fatigue.  HENT: Negative for drooling, ear discharge, ear pain and sore throat.   Eyes: Negative for blurred vision.  Respiratory: Negative for cough, shortness of breath and wheezing.   Cardiovascular: Negative for chest pain, palpitations, orthopnea, leg swelling and PND.  Gastrointestinal: Negative for abdominal pain, blood in stool, constipation, diarrhea and nausea.  Endocrine: Negative for polydipsia.  Genitourinary: Negative for dysuria, frequency, hematuria and urgency.  Musculoskeletal: Negative for back pain, myalgias and neck pain.  Skin: Negative for rash.  Allergic/Immunologic: Negative for environmental allergies.  Neurological: Negative for dizziness and headaches.  Hematological: Does not bruise/bleed easily.  Psychiatric/Behavioral: Positive for depression. Negative for decreased concentration and suicidal ideas. The patient is not nervous/anxious and does not have insomnia.     Patient Active Problem List   Diagnosis Date Noted  . Essential hypertension 05/22/2016  . Depression, major, single episode, mild ([place]) 05/22/2016  . Hyperlipidemia 05/22/2016  . Nocturia 05/22/2016    No Known Allergies  Past Surgical History:  Procedure Laterality Date  . COLONOSCOPY  2014   repeat in 5 years  . TONSILLECTOMY      Social History   Tobacco Use  . Smoking status:  Current Every Day Smoker     Types: Cigarettes  . Smokeless tobacco: Current User    Types: Snuff  . Tobacco comment: wants patches  Substance Use Topics  . Alcohol use: Yes    Alcohol/week: 0.0 standard drinks  . Drug use: No     Medication list has been reviewed and updated.  Current Meds  Medication Sig  . aspirin EC 81 MG tablet Take 1 tablet by mouth daily.  Marland Kitchen lisinopril-hydrochlorothiazide (PRINZIDE,ZESTORETIC) 10-12.5 MG tablet TAKE 1 TABLET BY MOUTH EVERY DAY  . sertraline (ZOLOFT) 50 MG tablet TAKE 1 TABLET BY MOUTH EVERY DAY  . [DISCONTINUED] Multiple Vitamins-Minerals (MULTIVITAL PO) Take 1 tablet by mouth daily.    PHQ 2/9 Scores 04/02/2018  PHQ - 2 Score 0  PHQ- 9 Score 0    Physical Exam  Constitutional: He is oriented to person, place, and time. He is not irritable.  HENT:  Head: Normocephalic.  Right Ear: External ear normal.  Left Ear: External ear normal.  Nose: Nose normal.  Mouth/Throat: Oropharynx is clear and moist.  Eyes: Pupils are equal, round, and reactive to light. Conjunctivae and EOM are normal. Right eye exhibits no discharge. Left eye exhibits no discharge. No scleral icterus.  Neck: Normal range of motion. Neck supple. No JVD present. No tracheal deviation present. No thyromegaly present.  Cardiovascular: Normal rate, regular rhythm, S1 normal, S2 normal, normal heart sounds and intact distal pulses. Exam reveals no gallop, no S3, no S4 and no friction rub.  No murmur heard.  No systolic murmur is present.  No diastolic murmur is present. Pulmonary/Chest: Breath sounds normal. No respiratory distress. He has no wheezes. He has no rales.  Abdominal: Soft. Bowel sounds are normal. He exhibits no mass. There is no hepatosplenomegaly. There is no tenderness. There is no rebound, no guarding and no CVA tenderness.  Genitourinary: Rectum normal, prostate normal and penis normal. Rectal exam shows no mass and guaiac negative stool. Right testis shows no mass, no swelling and no  tenderness. Left testis shows no mass, no swelling and no tenderness.  Genitourinary Comments: Right hydrocoel/transilluminates  Musculoskeletal: Normal range of motion. He exhibits no edema or tenderness.  Lymphadenopathy:    He has no cervical adenopathy.  Neurological: He is alert and oriented to person, place, and time. He has normal strength and normal reflexes. No cranial nerve deficit.  Skin: Skin is warm. No rash noted.  Nursing note and vitals reviewed.   BP 140/62   Pulse 80   Ht 5\' 10"  (1.778 m)   Wt 175 lb (79.4 kg)   BMI 25.11 kg/m   Assessment and Plan: 1. Tobacco dependency Patient has been advised of the health risks of smoking and counseled concerning cessation of tobacco products. I spent over 3 minutes for discussion and to answer questions.  NicoDerm patches 21 mg called in. - nicotine (NICODERM CQ - DOSED IN MG/24 HOURS) 21 mg/24hr patch; Place 1 patch (21 mg total) onto the skin daily.  Dispense: 28 patch; Refill: 0  2. Essential hypertension Chronic.  Stable.  Continue lisinopril hydrochlorothiazide 10-12 0.5 daily.  Will check renal function panel. - lisinopril-hydrochlorothiazide (PRINZIDE,ZESTORETIC) 10-12.5 MG tablet; Take 1 tablet by mouth daily.  Dispense: 30 tablet; Refill: 5 - Renal Function Panel  3. Moderate episode of recurrent major depressive disorder (HCC) Chronic.  Under moderate stress.  Continue sertraline 50 mg daily. - sertraline (ZOLOFT) 50 MG tablet; Take 1 tablet (50 mg total) by mouth daily.  Dispense:  30 tablet; Refill: 5  4. Hyperlipidemia, unspecified hyperlipidemia type Current.  Low-cholesterol diet reemphasized.  Check lipid panel. - Lipid panel  5. Nocturia Patient has occasional episodes of nocturia.  Check PSA - PSA  6. Colon cancer screening Guaiac negative.  Referral to gastroenterology for scheduling of colon cancer screening wiyh colonoscopy. - Ambulatory referral to Gastroenterology     Dr. Otilio Miu [facility]  04/02/2018

## 2018-04-02 NOTE — Patient Instructions (Signed)
Coping with Quitting Smoking Quitting smoking is a physical and mental challenge. You will face cravings, withdrawal symptoms, and temptation. Before quitting, work with your health care provider to make a plan that can help you cope. Preparation can help you quit and keep you from giving in. How can I cope with cravings? Cravings usually last for 5-10 minutes. If you get through it, the craving will pass. Consider taking the following actions to help you cope with cravings:  Keep your mouth busy: ? Chew sugar-free gum. ? Suck on hard candies or a straw. ? Brush your teeth.  Keep your hands and body busy: ? Immediately change to a different activity when you feel a craving. ? Squeeze or play with a ball. ? Do an activity or a hobby, like making bead jewelry, practicing needlepoint, or working with wood. ? Mix up your normal routine. ? Take a short exercise break. Go for a quick walk or run up and down stairs. ? Spend time in public places where smoking is not allowed.  Focus on doing something kind or helpful for someone else.  Call a friend or family member to talk during a craving.  Join a support group.  Call a quit line, such as 1-800-QUIT-NOW.  Talk with your health care provider about medicines that might help you cope with cravings and make quitting easier for you.  How can I deal with withdrawal symptoms? Your body may experience negative effects as it tries to get used to not having nicotine in the system. These effects are called withdrawal symptoms. They may include:  Feeling hungrier than normal.  Trouble concentrating.  Irritability.  Trouble sleeping.  Feeling depressed.  Restlessness and agitation.  Craving a cigarette.  To manage withdrawal symptoms:  Avoid places, people, and activities that trigger your cravings.  Remember why you want to quit.  Get plenty of sleep.  Avoid coffee and other caffeinated drinks. These may worsen some of your  symptoms.  How can I handle social situations? Social situations can be difficult when you are quitting smoking, especially in the first few weeks. To manage this, you can:  Avoid parties, bars, and other social situations where people might be smoking.  Avoid alcohol.  Leave right away if you have the urge to smoke.  Explain to your family and friends that you are quitting smoking. Ask for understanding and support.  Plan activities with friends or family where smoking is not an option.  What are some ways I can cope with stress? Wanting to smoke may cause stress, and stress can make you want to smoke. Find ways to manage your stress. Relaxation techniques can help. For example:  Breathe slowly and deeply, in through your nose and out through your mouth.  Listen to soothing, relaxing music.  Talk with a family member or friend about your stress.  Light a candle.  Soak in a bath or take a shower.  Think about a peaceful place.  What are some ways I can prevent weight gain? Be aware that many people gain weight after they quit smoking. However, not everyone does. To keep from gaining weight, have a plan in place before you quit and stick to the plan after you quit. Your plan should include:  Having healthy snacks. When you have a craving, it may help to: ? Eat plain popcorn, crunchy carrots, celery, or other cut vegetables. ? Chew sugar-free gum.  Changing how you eat: ? Eat small portion sizes at meals. ?   Eat 4-6 small meals throughout the day instead of 1-2 large meals a day. ? Be mindful when you eat. Do not watch television or do other things that might distract you as you eat.  Exercising regularly: ? Make time to exercise each day. If you do not have time for a long workout, do short bouts of exercise for 5-10 minutes several times a day. ? Do some form of strengthening exercise, like weight lifting, and some form of aerobic exercise, like running or  swimming.  Drinking plenty of water or other low-calorie or no-calorie drinks. Drink 6-8 glasses of water daily, or as much as instructed by your health care provider.  Summary  Quitting smoking is a physical and mental challenge. You will face cravings, withdrawal symptoms, and temptation to smoke again. Preparation can help you as you go through these challenges.  You can cope with cravings by keeping your mouth busy (such as by chewing gum), keeping your body and hands busy, and making calls to family, friends, or a helpline for people who want to quit smoking.  You can cope with withdrawal symptoms by avoiding places where people smoke, avoiding drinks with caffeine, and getting plenty of rest.  Ask your health care provider about the different ways to prevent weight gain, avoid stress, and handle social situations. This information is not intended to replace advice given to you by your health care provider. Make sure you discuss any questions you have with your health care provider. Document Released: 04/06/2016 Document Revised: 04/06/2016 Document Reviewed: 04/06/2016 Elsevier Interactive Patient Education  2018 [employer]vier Inc.  

## 2018-04-02 NOTE — Telephone Encounter (Signed)
Pt is calling to schedule colon test. Referral in place

## 2018-04-03 ENCOUNTER — Other Ambulatory Visit: Payer: Self-pay

## 2018-04-03 DIAGNOSIS — Z86010 Personal history of colonic polyps: Secondary | ICD-10-CM

## 2018-04-03 LAB — LIPID PANEL
Chol/HDL Ratio: 3.1 ratio (ref 0.0–5.0)
Cholesterol, Total: 177 mg/dL (ref 100–199)
HDL: 57 mg/dL (ref >39)
LDL Calculated: 110 mg/dL — ABNORMAL HIGH (ref 0–99)
TRIGLYCERIDES: 52 mg/dL (ref 0–149)
VLDL Cholesterol Cal: 10 mg/dL (ref 5–40)

## 2018-04-03 LAB — RENAL FUNCTION PANEL
Albumin: 4.6 g/dL (ref 3.6–4.8)
BUN/Creatinine Ratio: 17 (ref 10–24)
BUN: 15 mg/dL (ref 8–27)
CO2: 25 mmol/L (ref 20–29)
Calcium: 9.6 mg/dL (ref 8.6–10.2)
Chloride: 101 mmol/L (ref 96–106)
Creatinine, Ser: 0.88 mg/dL (ref 0.76–1.27)
GFR calc Af Amer: 108 mL/min/{1.73_m2} (ref >59)
GFR calc non Af Amer: 93 mL/min/{1.73_m2} (ref >59)
Glucose: 95 mg/dL (ref 65–99)
Phosphorus: 3.1 mg/dL (ref 2.5–4.5)
Potassium: 4.7 mmol/L (ref 3.5–5.2)
SODIUM: 140 mmol/L (ref 134–144)

## 2018-04-03 LAB — PSA: Prostate Specific Ag, Serum: 1.5 ng/mL (ref 0.0–4.0)

## 2018-04-03 MED ORDER — NA SULFATE-K SULFATE-MG SULF 17.5-3.13-1.6 GM/177ML PO SOLN: 1.0000 | Freq: Once | ORAL | 0 refills | Status: AC

## 2018-04-03 NOTE — Telephone Encounter (Signed)
Colonoscopy has been scheduled for 04/08/18 with Dr. Vicente Males at [facility].  Instructions emailed to him at: [email or web address]. Prescription sent to [facility] in [place].  Thanks [employer]

## 2018-04-03 NOTE — Telephone Encounter (Signed)
Pt wife is calling she added pt cell number for you to call pt on his cell to schedule apt cell# [phone]

## 2018-04-04 ENCOUNTER — Encounter: Payer: Self-pay | Admitting: Gastroenterology

## 2018-04-04 ENCOUNTER — Telehealth: Payer: Self-pay | Admitting: Gastroenterology

## 2018-04-04 NOTE — Telephone Encounter (Signed)
Received a fax regarding pt procedure 04/08/18 it has been approved auth# [id number]

## 2018-04-07 ENCOUNTER — Encounter: Payer: Self-pay | Admitting: Student

## 2018-04-08 ENCOUNTER — Ambulatory Visit
Admission: RE | Admit: 2018-04-08 | Discharge: 2018-04-08 | Disposition: A | Discharge status: Home / Self Care | Payer: Commercial Managed Care - PPO | Attending: Gastroenterology | Admitting: Gastroenterology

## 2018-04-08 ENCOUNTER — Encounter: Payer: Self-pay | Admitting: *Deleted

## 2018-04-08 ENCOUNTER — Encounter
Admission: RE | Disposition: A | Discharge status: Home / Self Care | Payer: Self-pay | Source: Home / Self Care | Attending: Gastroenterology

## 2018-04-08 ENCOUNTER — Ambulatory Visit: Payer: Commercial Managed Care - PPO | Admitting: Anesthesiology

## 2018-04-08 DIAGNOSIS — F329 Major depressive disorder, single episode, unspecified: Secondary | ICD-10-CM | POA: Insufficient documentation

## 2018-04-08 DIAGNOSIS — F17210 Nicotine dependence, cigarettes, uncomplicated: Secondary | ICD-10-CM | POA: Diagnosis not present

## 2018-04-08 DIAGNOSIS — I10 Essential (primary) hypertension: Secondary | ICD-10-CM | POA: Insufficient documentation

## 2018-04-08 DIAGNOSIS — K5730 Diverticulosis of large intestine without perforation or abscess without bleeding: Secondary | ICD-10-CM | POA: Insufficient documentation

## 2018-04-08 DIAGNOSIS — D120 Benign neoplasm of cecum: Secondary | ICD-10-CM

## 2018-04-08 DIAGNOSIS — Z1211 Encounter for screening for malignant neoplasm of colon: Secondary | ICD-10-CM | POA: Insufficient documentation

## 2018-04-08 DIAGNOSIS — Z79899 Other long term (current) drug therapy: Secondary | ICD-10-CM | POA: Insufficient documentation

## 2018-04-08 DIAGNOSIS — K635 Polyp of colon: Secondary | ICD-10-CM | POA: Diagnosis not present

## 2018-04-08 DIAGNOSIS — K640 First degree hemorrhoids: Secondary | ICD-10-CM | POA: Diagnosis not present

## 2018-04-08 DIAGNOSIS — Z7982 Long term (current) use of aspirin: Secondary | ICD-10-CM | POA: Insufficient documentation

## 2018-04-08 DIAGNOSIS — Z86010 Personal history of colonic polyps: Secondary | ICD-10-CM | POA: Insufficient documentation

## 2018-04-08 HISTORY — PX: COLONOSCOPY WITH PROPOFOL: SHX5780

## 2018-04-08 SURGERY — COLONOSCOPY WITH PROPOFOL
Anesthesia: General

## 2018-04-08 MED ORDER — PROPOFOL 10 MG/ML IV BOLUS
INTRAVENOUS | Status: DC | PRN
  Administered 2018-04-08: 80 mg via INTRAVENOUS
  Administered 2018-04-08: 20 mg via INTRAVENOUS

## 2018-04-08 MED ORDER — PROPOFOL 500 MG/50ML IV EMUL
INTRAVENOUS | Status: AC
  Filled 2018-04-08: qty 50

## 2018-04-08 MED ORDER — LIDOCAINE HCL (PF) 2 % IJ SOLN
INTRAMUSCULAR | Status: AC
  Filled 2018-04-08: qty 10

## 2018-04-08 MED ORDER — PROPOFOL 500 MG/50ML IV EMUL
INTRAVENOUS | Status: DC | PRN
  Administered 2018-04-08: 200 ug/kg/min via INTRAVENOUS

## 2018-04-08 MED ORDER — LIDOCAINE 2% (20 MG/ML) 5 ML SYRINGE
INTRAMUSCULAR | Status: DC | PRN
  Administered 2018-04-08: 50 mg via INTRAVENOUS

## 2018-04-08 MED ORDER — SODIUM CHLORIDE 0.9 % IV SOLN
INTRAVENOUS | Status: DC
  Administered 2018-04-08: 1000 mL via INTRAVENOUS

## 2018-04-08 NOTE — Anesthesia Post-op Follow-up Note (Signed)
Anesthesia QCDR form completed.        

## 2018-04-08 NOTE — Op Note (Signed)
[facility] Patient Name: Jordan Morton Procedure Date: 04/08/2018 9:14 AM MRN: [id number] Account #: [id number] Date of Birth: 18-May-1955 Admit Type: Outpatient Age: 62 Room: [facility] ENDO ROOM 1 Gender: Male Note Status: Finalized Procedure:            Colonoscopy Indications:          High risk colon cancer surveillance: Personal history                        of colonic polyps Providers:            Jonathon Bellows MD, MD Referring MD:         Juline Patch, MD (Referring MD) Medicines:            Monitored Anesthesia Care Complications:        No immediate complications. Procedure:            Pre-Anesthesia Assessment:                       - Prior to the procedure, a History and Physical was                        performed, and patient medications, allergies and                        sensitivities were reviewed. The patient's tolerance of                        previous anesthesia was reviewed.                       - The risks and benefits of the procedure and the                        sedation options and risks were discussed with the                        patient. All questions were answered and informed                        consent was obtained.                       - ASA Grade Assessment: II - A patient with mild                        systemic disease.                       After obtaining informed consent, the colonoscope was                        passed under direct vision. Throughout the procedure,                        the patient's blood pressure, pulse, and oxygen                        saturations were monitored continuously. The  Colonoscope was introduced through the anus and                        advanced to the the cecum, identified by the                        appendiceal orifice, IC valve and transillumination.                        The colonoscopy was performed with ease. The patient                  tolerated the procedure well. The quality of the bowel                        preparation was good. Findings:      The perianal and digital rectal examinations were normal.      Non-bleeding internal hemorrhoids were found during retroflexion. The       hemorrhoids were medium-sized and Grade I (internal hemorrhoids that do       not prolapse).      Multiple small-mouthed diverticula were found in the sigmoid colon.      A 3 mm polyp was found in the cecum. The polyp was sessile. The polyp       was removed with a cold biopsy forceps. Resection and retrieval were       complete.      The exam was otherwise without abnormality on direct and retroflexion       views. Impression:           - Non-bleeding internal hemorrhoids.                       - Diverticulosis in the sigmoid colon.                       - One 3 mm polyp in the cecum, removed with a cold                        biopsy forceps. Resected and retrieved.                       - The examination was otherwise normal on direct and                        retroflexion views. Recommendation:       - Discharge patient to home (with escort).                       - Resume previous diet.                       - Continue present medications.                       - Await pathology results.                       - Repeat colonoscopy in 5 years for surveillance. Procedure Code(s):    --- Professional ---                       [phone], Colonoscopy, flexible; with biopsy, single  or                        multiple Diagnosis Code(s):    --- Professional ---                       Z86.010, Personal history of colonic polyps                       K64.0, First degree hemorrhoids                       D12.0, Benign neoplasm of cecum                       K57.30, Diverticulosis of large intestine without                        perforation or abscess without bleeding CPT copyright 2018 American Medical Association. All rights  reserved. The codes documented in this report are preliminary and upon coder review may  be revised to meet current compliance requirements. Jonathon Bellows, MD Jonathon Bellows MD, MD 04/08/2018 9:48:22 AM This report has been signed electronically. Number of Addenda: 0 Note Initiated On: 04/08/2018 9:14 AM Scope Withdrawal Time: 0 hours 13 minutes 33 seconds  Total Procedure Duration: 0 hours 17 minutes 33 seconds       [facility]

## 2018-04-08 NOTE — Transfer of Care (Signed)
Immediate Anesthesia Transfer of Care Note  Patient: Jordan Morton  Procedure(s) Performed: COLONOSCOPY WITH PROPOFOL (N/A )  Patient Location: Endoscopy Unit  Anesthesia Type:General  Level of Consciousness: awake, alert  and oriented  Airway & Oxygen Therapy: Patient connected to nasal cannula oxygen  Post-op Assessment: Post -op Vital signs reviewed and stable  Post vital signs: stable  Last Vitals:  Vitals Value Taken Time  BP 111/81 04/08/2018  9:51 AM  Temp 36.1 C 04/08/2018  9:51 AM  Pulse 98 04/08/2018  9:51 AM  Resp 20 04/08/2018  9:51 AM  SpO2 98 % 04/08/2018  9:51 AM    Last Pain:  Vitals:   04/08/18 0951  TempSrc: Tympanic  PainSc: 0-No pain         Complications: No apparent anesthesia complications

## 2018-04-08 NOTE — Anesthesia Preprocedure Evaluation (Signed)
Anesthesia Evaluation  Patient identified by MRN, date of birth, ID band Patient awake    Reviewed: Allergy & Precautions, H&P , NPO status , Patient's Chart, lab work & pertinent test results, reviewed documented beta blocker date and time   Airway Mallampati: II   Neck ROM: full    Dental  (+) Poor Dentition   Pulmonary neg pulmonary ROS, Current Smoker,    Pulmonary exam normal        Cardiovascular Exercise Tolerance: Good hypertension, On Medications negative cardio ROS Normal cardiovascular exam Rhythm:regular Rate:Normal     Neuro/Psych negative neurological ROS  negative psych ROS   GI/Hepatic negative GI ROS, Neg liver ROS,   Endo/Other  negative endocrine ROS  Renal/GU negative Renal ROS  negative genitourinary   Musculoskeletal   Abdominal   Peds  Hematology negative hematology ROS (+)   Anesthesia Other Findings Past Medical History: No date: Depression No date: Hypertension Past Surgical History: 2014: COLONOSCOPY     Comment:  repeat in 5 years No date: TONSILLECTOMY   Reproductive/Obstetrics negative OB ROS                             Anesthesia Physical Anesthesia Plan  ASA: II  Anesthesia Plan: General   Post-op Pain Management:    Induction:   PONV Risk Score and Plan:   Airway Management Planned:   Additional Equipment:   Intra-op Plan:   Post-operative Plan:   Informed Consent: I have reviewed the patients History and Physical, chart, labs and discussed the procedure including the risks, benefits and alternatives for the proposed anesthesia with the patient or authorized representative who has indicated his/her understanding and acceptance.   Dental Advisory Given  Plan Discussed with: CRNA  Anesthesia Plan Comments:         Anesthesia Quick Evaluation

## 2018-04-08 NOTE — H&P (Signed)
Jordan Bellows, MD [address], [place], [place], [place], [address] [address], [place], [place], [place], [address] Phone: [phone]  Fax: [phone]  Primary Care Physician:  Juline Patch, MD   Pre-Procedure History & Physical: HPI:  Jordan Morton is a 62 y.o. male is here for an colonoscopy.   Past Medical History:  Diagnosis Date  . Depression   . Hypertension     Past Surgical History:  Procedure Laterality Date  . COLONOSCOPY  2014   repeat in 5 years  . TONSILLECTOMY      Prior to Admission medications   Medication Sig Start Date End Date Taking? Authorizing Provider  aspirin EC 81 MG tablet Take 1 tablet by mouth daily.   Yes [provider]  lisinopril-hydrochlorothiazide (PRINZIDE,ZESTORETIC) 10-12.5 MG tablet Take 1 tablet by mouth daily. 04/02/18  Yes Juline Patch, MD  sertraline (ZOLOFT) 50 MG tablet Take 1 tablet (50 mg total) by mouth daily. 04/02/18  Yes Juline Patch, MD  nicotine (NICODERM CQ - DOSED IN MG/24 HOURS) 21 mg/24hr patch Place 1 patch (21 mg total) onto the skin daily. 04/02/18   Juline Patch, MD    Allergies as of 04/03/2018  . (No Known Allergies)    Family History  Problem Relation Age of Onset  . Cancer Mother   . Cancer Father     Social History   Socioeconomic History  . Marital status: Married    Spouse name: Not on file  . Number of children: Not on file  . Years of education: Not on file  . Highest education level: Not on file  Occupational History  . Not on file  Social Needs  . Financial resource strain: Not on file  . Food insecurity:    Worry: Not on file    Inability: Not on file  . Transportation needs:    Medical: Not on file    Non-medical: Not on file  Tobacco Use  . Smoking status: Current Every Day Smoker    Types: Cigarettes  . Smokeless tobacco: Current User    Types: Snuff  . Tobacco comment: wants patches  Substance and Sexual Activity  . Alcohol use: Yes   Alcohol/week: 0.0 standard drinks  . Drug use: No  . Sexual activity: Yes  Lifestyle  . Physical activity:    Days per week: Not on file    Minutes per session: Not on file  . Stress: Not on file  Relationships  . Social connections:    Talks on phone: Not on file    Gets together: Not on file    Attends religious service: Not on file    Active member of club or organization: Not on file    Attends meetings of clubs or organizations: Not on file    Relationship status: Not on file  . Intimate partner violence:    Fear of current or ex partner: Not on file    Emotionally abused: Not on file    Physically abused: Not on file    Forced sexual activity: Not on file  Other Topics Concern  . Not on file  Social History Narrative  . Not on file    Review of Systems: See HPI, otherwise negative ROS  Physical Exam: BP 129/89   Pulse 96   Temp (!) 97 F (36.1 C) (Tympanic)   Resp 18   Ht 5\' 10"  (1.778 m)   Wt 78.9 kg   SpO2 99%  BMI 24.97 kg/m  General:   Alert,  pleasant and cooperative in NAD Head:  Normocephalic and atraumatic. Neck:  Supple; no masses or thyromegaly. Lungs:  Clear throughout to auscultation, normal respiratory effort.    Heart:  +S1, +S2, Regular rate and rhythm, No edema. Abdomen:  Soft, nontender and nondistended. Normal bowel sounds, without guarding, and without rebound.   Neurologic:  Alert and  oriented x4;  grossly normal neurologically.  Impression/Plan: Jordan Morton is here for an colonoscopy to be performed for surveillance due to prior history of colon polyps   Risks, benefits, limitations, and alternatives regarding  colonoscopy have been reviewed with the patient.  Questions have been answered.  All parties agreeable.   Jordan Bellows, MD  04/08/2018, 9:16 AM

## 2018-04-09 ENCOUNTER — Encounter: Payer: Self-pay | Admitting: Gastroenterology

## 2018-04-09 NOTE — Anesthesia Postprocedure Evaluation (Signed)
Anesthesia Post Note  Patient: Jordan Morton  Procedure(s) Performed: COLONOSCOPY WITH PROPOFOL (N/A )  Patient location during evaluation: PACU Anesthesia Type: General Level of consciousness: awake and alert Pain management: pain level controlled Vital Signs Assessment: post-procedure vital signs reviewed and stable Respiratory status: spontaneous breathing, nonlabored ventilation, respiratory function stable and patient connected to nasal cannula oxygen Cardiovascular status: blood pressure returned to baseline and stable Postop Assessment: no apparent nausea or vomiting Anesthetic complications: no     Last Vitals:  Vitals:   04/08/18 1001 04/08/18 1011  BP: 129/86 129/86  Pulse: 88 72  Resp: 17 15  Temp:    SpO2: 99% 100%    Last Pain:  Vitals:   04/09/18 0802  TempSrc:   PainSc: 0-No pain                 Molli Barrows

## 2018-04-11 LAB — SURGICAL PATHOLOGY

## 2018-04-13 ENCOUNTER — Encounter: Payer: Self-pay | Admitting: Gastroenterology

## 2018-04-24 ENCOUNTER — Other Ambulatory Visit: Payer: Self-pay

## 2018-04-24 ENCOUNTER — Other Ambulatory Visit: Payer: Self-pay | Admitting: Family Medicine

## 2018-04-24 DIAGNOSIS — F331 Major depressive disorder, recurrent, moderate: Secondary | ICD-10-CM

## 2018-04-24 DIAGNOSIS — J0110 Acute frontal sinusitis, unspecified: Secondary | ICD-10-CM

## 2018-04-24 MED ORDER — AMOXICILLIN 500 MG PO CAPS: 500.0000 mg | ORAL_CAPSULE | Freq: Three times a day (TID) | ORAL | 0 refills | Status: DC

## 2018-05-30 ENCOUNTER — Encounter: Payer: Self-pay | Admitting: Family Medicine

## 2018-05-30 ENCOUNTER — Ambulatory Visit: Payer: Commercial Managed Care - PPO | Admitting: Family Medicine

## 2018-05-30 VITALS — BP 128/80 | HR 110 | Temp 101.0°F | Ht 70.0 in | Wt 175.0 lb

## 2018-05-30 DIAGNOSIS — J111 Influenza due to unidentified influenza virus with other respiratory manifestations: Secondary | ICD-10-CM

## 2018-05-30 LAB — POCT INFLUENZA A/B
Influenza A, POC: NEGATIVE
Influenza B, POC: NEGATIVE

## 2018-05-30 MED ORDER — OSELTAMIVIR PHOSPHATE 75 MG PO CAPS: 75.0000 mg | ORAL_CAPSULE | Freq: Two times a day (BID) | ORAL | 0 refills | Status: DC

## 2018-05-30 MED ORDER — GUAIFENESIN-CODEINE 100-10 MG/5ML PO SYRP: 5.0000 mL | ORAL_SOLUTION | Freq: Four times a day (QID) | ORAL | 0 refills | Status: DC | PRN

## 2018-05-30 NOTE — Progress Notes (Signed)
Date:  05/30/2018   Name:  Jordan Morton   DOB:  [birth year]   MRN:  [id number]   Chief Complaint: Fever (fever and chills x 2 days)  Fever   This is a new problem. The current episode started in the past 7 days (wednesday night). The problem occurs constantly. The problem has been waxing and waning. The maximum temperature noted was 101 to 101.9 F. The temperature was taken using an oral thermometer. Associated symptoms include coughing, headaches, muscle aches, nausea, sleepiness and a sore throat. Pertinent negatives include no abdominal pain, chest pain, congestion, diarrhea, ear pain, rash, urinary pain, vomiting or wheezing. He has tried acetaminophen and fluids for the symptoms. The treatment provided moderate relief.  Risk factors: no recent travel and no sick contacts     Review of Systems  Constitutional: Positive for fever. Negative for chills.  HENT: Positive for sore throat. Negative for congestion, drooling, ear discharge and ear pain.   Respiratory: Positive for cough. Negative for shortness of breath and wheezing.   Cardiovascular: Negative for chest pain, palpitations and leg swelling.  Gastrointestinal: Positive for nausea. Negative for abdominal pain, blood in stool, constipation, diarrhea and vomiting.  Endocrine: Negative for polydipsia.  Genitourinary: Negative for dysuria, frequency, hematuria and urgency.  Musculoskeletal: Negative for back pain, myalgias and neck pain.  Skin: Negative for rash.  Allergic/Immunologic: Negative for environmental allergies.  Neurological: Positive for headaches. Negative for dizziness.  Hematological: Does not bruise/bleed easily.  Psychiatric/Behavioral: Negative for suicidal ideas. The patient is not nervous/anxious.     Patient Active Problem List   Diagnosis Date Noted  . Essential hypertension 05/22/2016  . Depression, major, single episode, mild ([place]) 05/22/2016  . Hyperlipidemia 05/22/2016  . Nocturia 05/22/2016     No Known Allergies  Past Surgical History:  Procedure Laterality Date  . COLONOSCOPY  2014   repeat in 5 years  . COLONOSCOPY WITH PROPOFOL N/A 04/08/2018   Procedure: COLONOSCOPY WITH PROPOFOL;  Surgeon: Jonathon Bellows, MD;  Location: [facility];  Service: Gastroenterology;  Laterality: N/A;  . TONSILLECTOMY      Social History   Tobacco Use  . Smoking status: Current Every Day Smoker    Types: Cigarettes  . Smokeless tobacco: Current User    Types: Snuff  . Tobacco comment: wants patches  Substance Use Topics  . Alcohol use: Yes    Alcohol/week: 0.0 standard drinks  . Drug use: No     Medication list has been reviewed and updated.  Current Meds  Medication Sig  . aspirin EC 81 MG tablet Take 1 tablet by mouth daily.  Marland Kitchen lisinopril-hydrochlorothiazide (PRINZIDE,ZESTORETIC) 10-12.5 MG tablet Take 1 tablet by mouth daily.  . sertraline (ZOLOFT) 50 MG tablet TAKE 1 TABLET BY MOUTH EVERY DAY  . [DISCONTINUED] amoxicillin (AMOXIL) 500 MG capsule Take 1 capsule (500 mg total) by mouth 3 (three) times daily.    PHQ 2/9 Scores 05/30/2018 04/02/2018  PHQ - 2 Score 0 0  PHQ- 9 Score 0 0    Physical Exam Vitals signs and nursing note reviewed.  HENT:     Head: Normocephalic.     Right Ear: Tympanic membrane, ear canal and external ear normal.     Left Ear: Tympanic membrane, ear canal and external ear normal.     Nose: Congestion present. No rhinorrhea.     Mouth/Throat:     Mouth: Mucous membranes are moist.     Pharynx: No oropharyngeal exudate.  Eyes:  General: No scleral icterus.       Right eye: No discharge.        Left eye: No discharge.     Conjunctiva/sclera: Conjunctivae normal.     Pupils: Pupils are equal, round, and reactive to light.  Neck:     Musculoskeletal: Normal range of motion and neck supple.     Thyroid: No thyromegaly.     Vascular: No JVD.     Trachea: No tracheal deviation.  Cardiovascular:     Rate and Rhythm: Normal rate and  regular rhythm.     Pulses: Normal pulses.     Heart sounds: Normal heart sounds. No murmur. No friction rub. No gallop.   Pulmonary:     Effort: Pulmonary effort is normal. No respiratory distress.     Breath sounds: Normal breath sounds. No stridor. No wheezing, rhonchi or rales.  Chest:     Chest wall: No tenderness.  Abdominal:     General: Bowel sounds are normal.     Palpations: Abdomen is soft. There is no mass.     Tenderness: There is no abdominal tenderness. There is no right CVA tenderness, left CVA tenderness, guarding or rebound.     Hernia: No hernia is present.  Musculoskeletal: Normal range of motion.        General: No tenderness.  Lymphadenopathy:     Cervical: No cervical adenopathy.  Skin:    General: Skin is warm.     Findings: No rash.  Neurological:     Mental Status: He is alert and oriented to person, place, and time.     Cranial Nerves: No cranial nerve deficit.     Deep Tendon Reflexes: Reflexes are normal and symmetric.     BP 128/80   Pulse (!) 110   Temp (!) 101 F (38.3 C) (Oral)   Ht 5\' 10"  (1.778 m)   Wt 175 lb (79.4 kg)   SpO2 95%   BMI 25.11 kg/m   Assessment and Plan:  1. Influenza Discussion is been noted that there is been multiple cases of influenza a and B  in the urgent care.  The POCT test was negative I have elected to go ahead and treat given the likelihood that this is a false negative.  And will be given Tamiflu 75 mg twice a day. - oseltamivir (TAMIFLU) 75 MG capsule; Take 1 capsule (75 mg total) by mouth 2 (two) times daily.  Dispense: 10 capsule; Refill: 0 - POCT Influenza A/B

## 2018-05-30 NOTE — Patient Instructions (Signed)

## 2018-08-23 ENCOUNTER — Other Ambulatory Visit: Payer: Self-pay | Admitting: Family Medicine

## 2018-08-23 DIAGNOSIS — F331 Major depressive disorder, recurrent, moderate: Secondary | ICD-10-CM

## 2018-10-18 ENCOUNTER — Other Ambulatory Visit: Payer: Self-pay | Admitting: Family Medicine

## 2018-10-18 DIAGNOSIS — I10 Essential (primary) hypertension: Secondary | ICD-10-CM

## 2018-11-11 ENCOUNTER — Other Ambulatory Visit: Payer: Self-pay | Admitting: Family Medicine

## 2018-11-11 DIAGNOSIS — I10 Essential (primary) hypertension: Secondary | ICD-10-CM

## 2018-11-20 ENCOUNTER — Ambulatory Visit: Payer: Commercial Managed Care - PPO | Admitting: Family Medicine

## 2018-11-20 ENCOUNTER — Other Ambulatory Visit: Payer: Self-pay

## 2018-11-20 ENCOUNTER — Encounter: Payer: Self-pay | Admitting: Family Medicine

## 2018-11-20 DIAGNOSIS — F331 Major depressive disorder, recurrent, moderate: Secondary | ICD-10-CM | POA: Diagnosis not present

## 2018-11-20 DIAGNOSIS — I10 Essential (primary) hypertension: Secondary | ICD-10-CM

## 2018-11-20 MED ORDER — SERTRALINE HCL 50 MG PO TABS: 50.0000 mg | ORAL_TABLET | Freq: Every day | ORAL | 0 refills | Status: DC

## 2018-11-20 MED ORDER — LISINOPRIL-HYDROCHLOROTHIAZIDE 10-12.5 MG PO TABS: 1.0000 | ORAL_TABLET | Freq: Every day | ORAL | 1 refills | Status: DC

## 2018-11-20 NOTE — Progress Notes (Signed)
Date:  11/20/2018   Name:  Jordan Morton   DOB:  13-Dec-1955   MRN:  [id number]   Chief Complaint: Hypertension and Depression (PHQ9=0 and GAD7=0)  Hypertension This is a chronic problem. The current episode started more than 1 year ago. The problem is unchanged. The problem is controlled. Pertinent negatives include no anxiety, blurred vision, chest pain, headaches, malaise/fatigue, neck pain, orthopnea, palpitations, peripheral edema, PND, shortness of breath or sweats. There are no associated agents to hypertension. There are no known risk factors for coronary artery disease. Past treatments include ACE inhibitors and diuretics. The current treatment provides moderate improvement. There are no compliance problems.  There is no history of angina, kidney disease, CAD/MI, CVA, heart failure, left ventricular hypertrophy, PVD or retinopathy. There is no history of chronic renal disease, a hypertension causing med or renovascular disease.  Depression        This is a chronic problem.  The current episode started more than 1 year ago.   The onset quality is gradual.   The problem occurs intermittently.  The problem has been waxing and waning since onset.  Associated symptoms include no decreased concentration, no fatigue, no helplessness, no hopelessness, does not have insomnia, not irritable, no restlessness, no decreased interest, no appetite change, no body aches, no myalgias, no headaches, no indigestion, not sad and no suicidal ideas.  Past treatments include SSRIs - Selective serotonin reuptake inhibitors.  Compliance with treatment is good.  Previous treatment provided mild relief.   Pertinent negatives include no anxiety.   Review of Systems  Constitutional: Negative for appetite change, chills, fatigue, fever and malaise/fatigue.  HENT: Negative for drooling, ear discharge, ear pain and sore throat.   Eyes: Negative for blurred vision.  Respiratory: Negative for cough, shortness of  breath and wheezing.   Cardiovascular: Negative for chest pain, palpitations, orthopnea, leg swelling and PND.  Gastrointestinal: Negative for abdominal pain, blood in stool, constipation, diarrhea and nausea.  Endocrine: Negative for polydipsia.  Genitourinary: Negative for dysuria, frequency, hematuria and urgency.  Musculoskeletal: Negative for back pain, myalgias and neck pain.  Skin: Negative for rash.  Allergic/Immunologic: Negative for environmental allergies.  Neurological: Negative for dizziness and headaches.  Hematological: Does not bruise/bleed easily.  Psychiatric/Behavioral: Positive for depression. Negative for decreased concentration and suicidal ideas. The patient is not nervous/anxious and does not have insomnia.     Patient Active Problem List   Diagnosis Date Noted  . Essential hypertension 05/22/2016  . Depression, major, single episode, mild ([place]) 05/22/2016  . Hyperlipidemia 05/22/2016  . Nocturia 05/22/2016    No Known Allergies  Past Surgical History:  Procedure Laterality Date  . COLONOSCOPY  2014   repeat in 5 years  . COLONOSCOPY WITH PROPOFOL N/A 04/08/2018   Procedure: COLONOSCOPY WITH PROPOFOL;  Surgeon: Jonathon Bellows, MD;  Location: [facility] END[facility];  Service: Gastroenterology;  Laterality: N/A;  . TONSILLECTOMY      Social History   Tobacco Use  . Smoking status: Current Every Day Smoker    Types: Cigarettes  . Smokeless tobacco: Current User    Types: Snuff  Substance Use Topics  . Alcohol use: Yes    Alcohol/week: 0.0 standard drinks  . Drug use: No     Medication list has been reviewed and updated.  Current Meds  Medication Sig  . aspirin EC 81 MG tablet Take 1 tablet by mouth daily.  Marland Kitchen lisinopril-hydrochlorothiazide (ZESTORETIC) 10-12.5 MG tablet TAKE 1 TABLET BY MOUTH EVERY DAY  .  sertraline (ZOLOFT) 50 MG tablet TAKE 1 TABLET BY MOUTH EVERY DAY    PHQ 2/9 Scores 11/20/2018 05/30/2018 04/02/2018  PHQ - 2 Score 0 0 0  PHQ- 9 Score  0 0 0    BP Readings from Last 3 Encounters:  11/20/18 130/80  05/30/18 128/80  04/08/18 129/86    Physical Exam Vitals signs and nursing note reviewed.  Constitutional:      General: He is not irritable. HENT:     Head: Normocephalic.     Right Ear: Tympanic membrane, ear canal and external ear normal.     Left Ear: Tympanic membrane, ear canal and external ear normal.     Nose: Nose normal.  Eyes:     General: No scleral icterus.       Right eye: No discharge.        Left eye: No discharge.     Conjunctiva/sclera: Conjunctivae normal.     Pupils: Pupils are equal, round, and reactive to light.  Neck:     Musculoskeletal: Normal range of motion and neck supple.     Thyroid: No thyromegaly.     Vascular: No JVD.     Trachea: No tracheal deviation.  Cardiovascular:     Rate and Rhythm: Normal rate and regular rhythm.     Heart sounds: Normal heart sounds, S1 normal and S2 normal. No murmur. No systolic murmur. No diastolic murmur. No friction rub. No gallop. No S3 or S4 sounds.   Pulmonary:     Effort: No respiratory distress.     Breath sounds: Normal breath sounds. No decreased breath sounds, wheezing, rhonchi or rales.  Abdominal:     General: Bowel sounds are normal.     Palpations: Abdomen is soft. There is no mass.     Tenderness: There is no abdominal tenderness. There is no guarding or rebound.  Musculoskeletal: Normal range of motion.        General: No tenderness.  Lymphadenopathy:     Cervical: No cervical adenopathy.  Skin:    General: Skin is warm.     Findings: No rash.  Neurological:     Mental Status: He is alert and oriented to person, place, and time.     Cranial Nerves: No cranial nerve deficit.     Deep Tendon Reflexes: Reflexes are normal and symmetric.     Wt Readings from Last 3 Encounters:  11/20/18 168 lb (76.2 kg)  05/30/18 175 lb (79.4 kg)  04/08/18 174 lb (78.9 kg)    BP 130/80   Pulse 80   Ht 5\' 10"  (1.778 m)   Wt 168 lb (76.2  kg)   BMI 24.11 kg/m   Assessment and Plan:  1. Essential hypertension Chronic.  Continue lisinopril hydrochlorothiazide 10-12 0.5.  Recheck renal function panel. - lisinopril-hydrochlorothiazide (ZESTORETIC) 10-12.5 MG tablet; Take 1 tablet by mouth daily.  Dispense: 90 tablet; Refill: 1  2. Moderate episode of recurrent major depressive disorder (HCC) Chronic.  Controlled.  Continue Zoloft 50 mg once a day - sertraline (ZOLOFT) 50 MG tablet; Take 1 tablet (50 mg total) by mouth daily.  Dispense: 90 tablet; Refill: 0

## 2019-02-22 ENCOUNTER — Other Ambulatory Visit: Payer: Self-pay | Admitting: Family Medicine

## 2019-02-22 DIAGNOSIS — F331 Major depressive disorder, recurrent, moderate: Secondary | ICD-10-CM

## 2019-04-28 ENCOUNTER — Encounter: Payer: Self-pay | Admitting: Family Medicine

## 2019-04-28 ENCOUNTER — Other Ambulatory Visit: Payer: Self-pay

## 2019-04-28 ENCOUNTER — Ambulatory Visit: Payer: Commercial Managed Care - PPO | Admitting: Family Medicine

## 2019-04-28 VITALS — BP 140/88 | HR 88 | Ht 70.0 in | Wt 165.0 lb

## 2019-04-28 DIAGNOSIS — E7801 Familial hypercholesterolemia: Secondary | ICD-10-CM | POA: Diagnosis not present

## 2019-04-28 DIAGNOSIS — F331 Major depressive disorder, recurrent, moderate: Secondary | ICD-10-CM

## 2019-04-28 DIAGNOSIS — R351 Nocturia: Secondary | ICD-10-CM

## 2019-04-28 DIAGNOSIS — Z23 Encounter for immunization: Secondary | ICD-10-CM | POA: Diagnosis not present

## 2019-04-28 DIAGNOSIS — Z1211 Encounter for screening for malignant neoplasm of colon: Secondary | ICD-10-CM | POA: Diagnosis not present

## 2019-04-28 DIAGNOSIS — I10 Essential (primary) hypertension: Secondary | ICD-10-CM

## 2019-04-28 DIAGNOSIS — F17200 Nicotine dependence, unspecified, uncomplicated: Secondary | ICD-10-CM

## 2019-04-28 LAB — HEMOCCULT GUIAC POC 1CARD (OFFICE): Fecal Occult Blood, POC: NEGATIVE

## 2019-04-28 MED ORDER — LISINOPRIL-HYDROCHLOROTHIAZIDE 10-12.5 MG PO TABS: 1.0000 | ORAL_TABLET | Freq: Every day | ORAL | 1 refills | Status: DC

## 2019-04-28 MED ORDER — SERTRALINE HCL 50 MG PO TABS: 50.0000 mg | ORAL_TABLET | Freq: Every day | ORAL | 1 refills | Status: DC

## 2019-04-28 MED ORDER — ASPIRIN EC 81 MG PO TBEC: 81.0000 mg | DELAYED_RELEASE_TABLET | Freq: Every day | ORAL | 3 refills | Status: AC

## 2019-04-28 NOTE — Patient Instructions (Signed)

## 2019-04-28 NOTE — Addendum Note (Signed)
Addended by: Juline Patch on: 04/28/2019 03:53 PM   Modules accepted: Orders

## 2019-04-28 NOTE — Progress Notes (Signed)
Date:  04/28/2019   Name:  Jordan Morton   DOB:  03-28-1956   MRN:  [id number]   Chief Complaint: Hypertension, Depression (PHQ9=0 and GAD7=0), and TDAP  Hypertension This is a chronic problem. The current episode started more than 1 year ago. The problem has been gradually improving since onset. The problem is controlled. Pertinent negatives include no anxiety, blurred vision, chest pain, headaches, malaise/fatigue, neck pain, orthopnea, palpitations, peripheral edema, PND, shortness of breath or sweats. There are no associated agents to hypertension. Risk factors for coronary artery disease include dyslipidemia and smoking/tobacco exposure. Past treatments include ACE inhibitors and diuretics. The current treatment provides moderate improvement. There are no compliance problems.  There is no history of angina, kidney disease, CAD/MI, CVA, heart failure, left ventricular hypertrophy, PVD or retinopathy. There is no history of chronic renal disease, a hypertension causing med or renovascular disease.  Depression        This is a chronic problem.  The current episode started more than 1 year ago.   The onset quality is gradual.   The problem occurs intermittently.  Associated symptoms include no decreased concentration, no fatigue, no helplessness, no hopelessness, does not have insomnia, not irritable, no restlessness, no decreased interest, no appetite change, no body aches, no myalgias, no headaches, no indigestion, not sad and no suicidal ideas.  Past treatments include SSRIs - Selective serotonin reuptake inhibitors.  Compliance with treatment is good.   Pertinent negatives include no anxiety. Hyperlipidemia This is a chronic problem. The problem is uncontrolled (mild LDL elevation). Recent lipid tests were reviewed and are normal. He has no history of chronic renal disease, diabetes, liver disease or obesity. There are no known factors aggravating his hyperlipidemia. Pertinent negatives  include no chest pain, myalgias or shortness of breath. He is currently on no antihyperlipidemic treatment. There are no compliance problems.     Lab Results  Component Value Date   CREATININE 0.88 04/02/2018   BUN 15 04/02/2018   NA 140 04/02/2018   K 4.7 04/02/2018   CL 101 04/02/2018   CO2 25 04/02/2018   Lab Results  Component Value Date   CHOL 177 04/02/2018   HDL 57 04/02/2018   LDLCALC 110 (H) 04/02/2018   TRIG 52 04/02/2018   CHOLHDL 3.1 04/02/2018   No results found for: TSH No results found for: HGBA1C   Review of Systems  Constitutional: Negative for appetite change, chills, fatigue, fever and malaise/fatigue.  HENT: Negative for drooling, ear discharge, ear pain and sore throat.   Eyes: Negative for blurred vision.  Respiratory: Negative for cough, shortness of breath and wheezing.   Cardiovascular: Negative for chest pain, palpitations, orthopnea, leg swelling and PND.  Gastrointestinal: Negative for abdominal pain, blood in stool, constipation, diarrhea and nausea.  Endocrine: Negative for polydipsia.  Genitourinary: Negative for dysuria, frequency, hematuria and urgency.  Musculoskeletal: Negative for back pain, myalgias and neck pain.  Skin: Negative for rash.  Allergic/Immunologic: Negative for environmental allergies.  Neurological: Negative for dizziness and headaches.  Hematological: Does not bruise/bleed easily.  Psychiatric/Behavioral: Positive for depression. Negative for decreased concentration and suicidal ideas. The patient is not nervous/anxious and does not have insomnia.     Patient Active Problem List   Diagnosis Date Noted  . Essential hypertension 05/22/2016  . Depression, major, single episode, mild ([place]) 05/22/2016  . Hyperlipidemia 05/22/2016  . Nocturia 05/22/2016    No Known Allergies  Past Surgical History:  Procedure Laterality Date  .  COLONOSCOPY  2014   repeat in 5 years  . COLONOSCOPY WITH PROPOFOL N/A 04/08/2018    Procedure: COLONOSCOPY WITH PROPOFOL;  Surgeon: Jonathon Bellows, MD;  Location: [facility] ENDOSCOPY;  Service: Gastroenterology;  Laterality: N/A;  . TONSILLECTOMY      Social History   Tobacco Use  . Smoking status: Current Every Day Smoker    Types: Cigarettes  . Smokeless tobacco: Current User    Types: Snuff  Substance Use Topics  . Alcohol use: Yes    Alcohol/week: 0.0 standard drinks  . Drug use: No     Medication list has been reviewed and updated.  Current Meds  Medication Sig  . aspirin EC 81 MG tablet Take 1 tablet by mouth daily.  Marland Kitchen lisinopril-hydrochlorothiazide (ZESTORETIC) 10-12.5 MG tablet Take 1 tablet by mouth daily.  . sertraline (ZOLOFT) 50 MG tablet TAKE 1 TABLET BY MOUTH EVERY DAY    PHQ 2/9 Scores 04/28/2019 11/20/2018 05/30/2018 04/02/2018  PHQ - 2 Score 0 0 0 0  PHQ- 9 Score 0 0 0 0    BP Readings from Last 3 Encounters:  04/28/19 140/88  11/20/18 130/80  05/30/18 128/80    Physical Exam Vitals and nursing note reviewed.  Constitutional:      General: He is not irritable. HENT:     Head: Normocephalic.     Right Ear: Tympanic membrane, ear canal and external ear normal.     Left Ear: Tympanic membrane, ear canal and external ear normal.     Nose: Nose normal. No congestion or rhinorrhea.     Mouth/Throat:     Mouth: Mucous membranes are moist.  Eyes:     General: No scleral icterus.       Right eye: No discharge.        Left eye: No discharge.     Conjunctiva/sclera: Conjunctivae normal.     Pupils: Pupils are equal, round, and reactive to light.  Neck:     Thyroid: No thyromegaly.     Vascular: No JVD.     Trachea: No tracheal deviation.  Cardiovascular:     Rate and Rhythm: Normal rate and regular rhythm.     Heart sounds: Normal heart sounds. No murmur. No friction rub. No gallop.   Pulmonary:     Effort: No respiratory distress.     Breath sounds: Normal breath sounds. No wheezing, rhonchi or rales.  Abdominal:     General: Bowel sounds  are normal.     Palpations: Abdomen is soft. There is no mass.     Tenderness: There is no abdominal tenderness. There is no guarding or rebound.  Genitourinary:    Prostate: Normal. Not enlarged, not tender and no nodules present.     Rectum: Normal. Guaiac result negative.     Comments: Upper limit normal Musculoskeletal:        General: No tenderness. Normal range of motion.     Cervical back: Normal range of motion and neck supple.  Lymphadenopathy:     Cervical: No cervical adenopathy.  Skin:    General: Skin is warm.     Findings: No rash.  Neurological:     Mental Status: He is alert and oriented to person, place, and time.     Cranial Nerves: No cranial nerve deficit.     Sensory: No sensory deficit.     Deep Tendon Reflexes: Reflexes are normal and symmetric.     Wt Readings from Last 3 Encounters:  04/28/19 165 lb (74.8  kg)  11/20/18 168 lb (76.2 kg)  05/30/18 175 lb (79.4 kg)    BP 140/88   Pulse 88   Ht 5\' 10"  (1.778 m)   Wt 165 lb (74.8 kg)   BMI 23.68 kg/m   Assessment and Plan:  1. Essential hypertension Chronic.  Controlled.  Stable.  Continue lisinopril hydrochlorothiazide 10-12 0.5 once a day.  Will check renal function panel for GFR.  Will recheck patient in 6 months. - lisinopril-hydrochlorothiazide (ZESTORETIC) 10-12.5 MG tablet; Take 1 tablet by mouth daily.  Dispense: 90 tablet; Refill: 1 - Renal Function Panel  2. Moderate episode of recurrent major depressive disorder (HCC) Chronic.  Controlled.  Stable.  PHQ 0.  Gad 0.  Continue sertraline 50 mg once a day.  Will recheck patient in 6 months - sertraline (ZOLOFT) 50 MG tablet; Take 1 tablet (50 mg total) by mouth daily.  Dispense: 90 tablet; Refill: 1  3. Familial hypercholesterolemia Chronic.  Controlled.  Patient has mild elevation of LDL.  Will check lipid panel to see if this is changed and if statin may be necessary in the meantime patient has been encouraged with a Mediterranean diet. -  Lipid Panel With LDL/HDL Ratio  4. Nocturia Patient with history of nocturia on an episodic basis.  Will check PSA.  DRE reveals an upper limit normal size of prostate with no nodularity and no change in consistency. - PSA  5. Tobacco dependency Patient has been advised of the health risks of smoking and counseled concerning cessation of tobacco products. I spent over 3 minutes for discussion and to answer questions.  6. Need for diphtheria-tetanus-pertussis (Tdap) vaccine Discussed and administered - Tdap vaccine greater than or equal to 7yo IM

## 2019-04-29 LAB — RENAL FUNCTION PANEL
Albumin: 4.6 g/dL (ref 3.8–4.8)
BUN/Creatinine Ratio: 12 (ref 10–24)
BUN: 10 mg/dL (ref 8–27)
CO2: 26 mmol/L (ref 20–29)
Calcium: 9.7 mg/dL (ref 8.6–10.2)
Chloride: 99 mmol/L (ref 96–106)
Creatinine, Ser: 0.84 mg/dL (ref 0.76–1.27)
GFR calc Af Amer: 108 mL/min/{1.73_m2} (ref >59)
GFR calc non Af Amer: 94 mL/min/{1.73_m2} (ref >59)
Glucose: 91 mg/dL (ref 65–99)
Phosphorus: 3.4 mg/dL (ref 2.8–4.1)
Potassium: 4.8 mmol/L (ref 3.5–5.2)
Sodium: 139 mmol/L (ref 134–144)

## 2019-04-29 LAB — LIPID PANEL WITH LDL/HDL RATIO
Cholesterol, Total: 170 mg/dL (ref 100–199)
HDL: 70 mg/dL (ref >39)
LDL Chol Calc (NIH): 90 mg/dL (ref 0–99)
LDL/HDL Ratio: 1.3 ratio (ref 0.0–3.6)
Triglycerides: 49 mg/dL (ref 0–149)
VLDL Cholesterol Cal: 10 mg/dL (ref 5–40)

## 2019-04-29 LAB — PSA: Prostate Specific Ag, Serum: 1.6 ng/mL (ref 0.0–4.0)

## 2019-05-27 ENCOUNTER — Ambulatory Visit: Payer: Commercial Managed Care - PPO | Attending: Internal Medicine

## 2019-05-27 DIAGNOSIS — Z20822 Contact with and (suspected) exposure to covid-19: Secondary | ICD-10-CM

## 2019-05-28 LAB — NOVEL CORONAVIRUS, NAA: SARS-CoV-2, NAA: NOT DETECTED

## 2019-05-29 ENCOUNTER — Other Ambulatory Visit: Payer: Self-pay

## 2019-05-29 ENCOUNTER — Ambulatory Visit: Payer: Commercial Managed Care - PPO | Admitting: Family Medicine

## 2019-05-29 ENCOUNTER — Encounter: Payer: Self-pay | Admitting: Family Medicine

## 2019-05-29 VITALS — BP 140/70 | HR 76 | Temp 98.3°F | Ht 70.0 in | Wt 164.0 lb

## 2019-05-29 DIAGNOSIS — J0110 Acute frontal sinusitis, unspecified: Secondary | ICD-10-CM | POA: Diagnosis not present

## 2019-05-29 DIAGNOSIS — R05 Cough: Secondary | ICD-10-CM

## 2019-05-29 DIAGNOSIS — R059 Cough, unspecified: Secondary | ICD-10-CM

## 2019-05-29 MED ORDER — GUAIFENESIN-CODEINE 100-10 MG/5ML PO SYRP: 5.0000 mL | ORAL_SOLUTION | Freq: Four times a day (QID) | ORAL | 0 refills | Status: DC | PRN

## 2019-05-29 MED ORDER — AMOXICILLIN-POT CLAVULANATE 875-125 MG PO TABS: 1.0000 | ORAL_TABLET | Freq: Two times a day (BID) | ORAL | 0 refills | Status: DC

## 2019-05-29 NOTE — Progress Notes (Signed)
Patient: Jordan Morton, Male    DOB: 05/04/1955, 64 y.o.   MRN: [id number] Visit Date: 05/29/2019  Today's Provider: Otilio Miu, MD   Chief Complaint  Patient presents with  . Sinusitis    head pressure, sneezing, runny nose, ears hurting, L) ear feels like it has fluid in it. Cough- clear production, eyes watering. Covid neg x 2 days ago. Taking mucinex D x 1 1/2 weeks, Robitussin otc and Advil   Subjective:    ----------------------------------------------------------- Sinusitis This is a new problem. The current episode started in the past 7 days (Tuesday). The problem has been gradually worsening since onset. There has been no fever. His pain is at a severity of 3/10 (maxillary/frontal). The pain is mild. Associated symptoms include chills, congestion, coughing, diaphoresis, ear pain, headaches, a hoarse voice, sinus pressure, sneezing and a sore throat. Pertinent negatives include no neck pain, shortness of breath or swollen glands. Past treatments include oral decongestants and acetaminophen. The treatment provided moderate relief.    Review of Systems  Constitutional: Positive for chills and diaphoresis. Negative for fever.  HENT: Positive for congestion, ear pain, hoarse voice, sinus pressure, sneezing and sore throat. Negative for drooling, ear discharge, mouth sores, nosebleeds, postnasal drip and rhinorrhea.   Respiratory: Positive for cough. Negative for shortness of breath and wheezing.   Cardiovascular: Negative for chest pain, palpitations and leg swelling.  Gastrointestinal: Negative for abdominal pain, blood in stool, constipation, diarrhea and nausea.  Endocrine: Negative for polydipsia.  Genitourinary: Negative for dysuria, frequency, hematuria and urgency.  Musculoskeletal: Negative for back pain, myalgias and neck pain.  Skin: Negative for rash.  Allergic/Immunologic: Negative for environmental allergies.  Neurological: Positive for headaches. Negative for  dizziness.  Hematological: Does not bruise/bleed easily.  Psychiatric/Behavioral: Negative for suicidal ideas. The patient is not nervous/anxious.     Social History   Socioeconomic History  . Marital status: Married    Spouse name: Not on file  . Number of children: Not on file  . Years of education: Not on file  . Highest education level: Not on file  Occupational History  . Not on file  Tobacco Use  . Smoking status: Current Every Day Smoker    Types: Cigarettes  . Smokeless tobacco: Current User    Types: Snuff  Substance and Sexual Activity  . Alcohol use: Yes    Alcohol/week: 0.0 standard drinks  . Drug use: No  . Sexual activity: Yes  Other Topics Concern  . Not on file  Social History Narrative  . Not on file   Social Determinants of Health   Financial Resource Strain:   . Difficulty of Paying Living Expenses: Not on file  Food Insecurity:   . Worried About Charity fundraiser in the Last Year: Not on file  . Ran Out of Food in the Last Year: Not on file  Transportation Needs:   . Lack of Transportation (Medical): Not on file  . Lack of Transportation (Non-Medical): Not on file  Physical Activity:   . Days of Exercise per Week: Not on file  . Minutes of Exercise per Session: Not on file  Stress:   . Feeling of Stress : Not on file  Social Connections:   . Frequency of Communication with Friends and Family: Not on file  . Frequency of Social Gatherings with Friends and Family: Not on file  . Attends Religious Services: Not on file  . Active Member of Clubs or Organizations: Not on file  .  Attends Archivist Meetings: Not on file  . Marital Status: Not on file  Intimate Partner Violence:   . Fear of Current or Ex-Partner: Not on file  . Emotionally Abused: Not on file  . Physically Abused: Not on file  . Sexually Abused: Not on file    Patient Active Problem List   Diagnosis Date Noted  . Essential hypertension 05/22/2016  . Depression,  major, single episode, mild ([place]) 05/22/2016  . Hyperlipidemia 05/22/2016  . Nocturia 05/22/2016    Past Surgical History:  Procedure Laterality Date  . COLONOSCOPY  2014   repeat in 5 years  . COLONOSCOPY WITH PROPOFOL N/A 04/08/2018   Procedure: COLONOSCOPY WITH PROPOFOL;  Surgeon: Jonathon Bellows, MD;  Location: [facility] ENDOSCOPY;  Service: Gastroenterology;  Laterality: N/A;  . TONSILLECTOMY      His family history includes Cancer in his father and mother.     Current Meds  Medication Sig  . aspirin EC 81 MG tablet Take 1 tablet (81 mg total) by mouth daily.  Marland Kitchen lisinopril-hydrochlorothiazide (ZESTORETIC) 10-12.5 MG tablet Take 1 tablet by mouth daily.  . sertraline (ZOLOFT) 50 MG tablet Take 1 tablet (50 mg total) by mouth daily.    Patient Care Team: Juline Patch, MD as PCP - General (Family Medicine)       Objective:   Wt Readings from Last 3 Encounters:  05/29/19 164 lb (74.4 kg)  04/28/19 165 lb (74.8 kg)  11/20/18 168 lb (76.2 kg)    Vitals: BP 140/70   Pulse 76   Temp 98.3 F (36.8 C) (Oral)   Ht 5\' 10"  (1.778 m)   Wt 164 lb (74.4 kg)   SpO2 99%   BMI 23.53 kg/m   Physical Exam Vitals and nursing note reviewed.  HENT:     Head: Normocephalic.     Jaw: There is normal jaw occlusion.     Right Ear: Tympanic membrane, ear canal and external ear normal.     Left Ear: Tympanic membrane, ear canal and external ear normal.     Nose: Nose normal. No congestion or rhinorrhea.     Right Sinus: No maxillary sinus tenderness or frontal sinus tenderness.     Left Sinus: No maxillary sinus tenderness or frontal sinus tenderness.     Mouth/Throat:     Lips: Pink.     Mouth: Mucous membranes are moist.  Eyes:     General: No scleral icterus.       Right eye: No discharge.        Left eye: No discharge.     Conjunctiva/sclera: Conjunctivae normal.     Pupils: Pupils are equal, round, and reactive to light.  Neck:     Thyroid: No thyromegaly.     Vascular: No  JVD.     Trachea: No tracheal deviation.  Cardiovascular:     Rate and Rhythm: Normal rate and regular rhythm.     Heart sounds: Normal heart sounds. No murmur. No friction rub. No gallop.   Pulmonary:     Effort: No respiratory distress.     Breath sounds: Normal breath sounds. No wheezing, rhonchi or rales.  Abdominal:     General: Bowel sounds are normal.     Palpations: Abdomen is soft. There is no mass.     Tenderness: There is no abdominal tenderness. There is no guarding or rebound.  Musculoskeletal:        General: No tenderness. Normal range of motion.  Cervical back: Normal range of motion and neck supple.  Lymphadenopathy:     Cervical: No cervical adenopathy.  Skin:    General: Skin is warm.     Findings: No rash.  Neurological:     Mental Status: He is alert and oriented to person, place, and time.     Cranial Nerves: No cranial nerve deficit.     Deep Tendon Reflexes: Reflexes are normal and symmetric.     Activities of Daily Living No flowsheet data found.  Fall Risk Assessment Fall Risk  04/28/2019 04/02/2018  Falls in the past year? 0 0  Follow up Falls evaluation completed Falls evaluation completed     Depression Screen PHQ 2/9 Scores 05/29/2019 04/28/2019 11/20/2018 05/30/2018  PHQ - 2 Score 0 0 0 0  PHQ- 9 Score 0 0 0 0    No flowsheet data found.  Medicare Annual Wellness Visit Summary:  Reviewed patient's Family Medical History Reviewed and updated list of patient's medical providers Assessment of cognitive impairment was done Assessed patient's functional ability Established a written schedule for health screening [place] Completed and Reviewed  Exercise Activities and Dietary recommendations Goals   None     Immunization History  Administered Date(s) Administered  . Influenza-Unspecified 01/22/2019  . Tdap 04/28/2019    Health Maintenance  Topic Date Due  . Hepatitis C Screening  04/27/2020 (Originally  [birth year])  . HIV Screening  04/27/2020 (Originally 04/[birth year]/1972)  . COLONOSCOPY  04/09/2023  . TETANUS/TDAP  04/27/2029  . INFLUENZA VACCINE  Completed    Discussed health benefits of physical activity, and encouraged him to engage in regular exercise appropriate for his age and condition.    ------------------------------------------------------------------------------------------------------------  Assessment & Plan:    1. Acute non-recurrent frontal sinusitis Acute. Persistent. Stable. Patient recently noted to be Covid negative. Exam and history consistent with frontal sinusitis which is recurrent at times. Will initiate Augmentin 875 mg twice a day for 10 days. - amoxicillin-clavulanate (AUGMENTIN) 875-125 MG tablet; Take 1 tablet by mouth 2 (two) times daily.  Dispense: 20 tablet; Refill: 0  2. Cough Patient with cough that is persistent. Will give guaifenesin with codeine as Robitussin-AC 1 teaspoon every 6 hours to patient's been encouraged to drink lots of fluids to allow drainage of the sinuses. - guaiFENesin-codeine (ROBITUSSIN AC) 100-10 MG/5ML syrup; Take 5 mLs by mouth 4 (four) times daily as needed for cough.  Dispense: 118 mL; Refill: 0

## 2019-07-24 ENCOUNTER — Ambulatory Visit: Payer: Commercial Managed Care - PPO | Attending: Internal Medicine

## 2019-07-24 DIAGNOSIS — Z23 Encounter for immunization: Secondary | ICD-10-CM

## 2019-07-24 NOTE — Progress Notes (Signed)
   Covid-19 Vaccination Clinic  Name:  Jordan Morton    MRN: [id number] DOB: Feb 05, 1956  07/24/2019  Jordan Morton was observed post Covid-19 immunization for 15 minutes without incident. He was provided with Vaccine Information Sheet and instruction to access the V-Safe system.   Jordan Morton was instructed to call 911 with any severe reactions post vaccine: Marland Kitchen Difficulty breathing  . Swelling of face and throat  . A fast heartbeat  . A bad rash all over body  . Dizziness and weakness   Immunizations Administered    Name Date Dose VIS Date Route   Pfizer COVID-19 Vaccine 07/24/2019 12:50 PM 0.3 mL 04/03/2019 Intramuscular   Manufacturer: [place]   Lot: [id number]   [place]: [id number]

## 2019-08-14 ENCOUNTER — Ambulatory Visit: Payer: Commercial Managed Care - PPO | Attending: Internal Medicine

## 2019-08-14 DIAGNOSIS — Z23 Encounter for immunization: Secondary | ICD-10-CM

## 2019-08-14 NOTE — Progress Notes (Signed)
   Covid-19 Vaccination Clinic  Name:  Jordan Morton    MRN: [id number] DOB: [birth year]  08/14/2019  [name] was observed post Covid-19 immunization for 15 minutes without incident. He was provided with Vaccine Information Sheet and instruction to access the V-Safe system.   [name] was instructed to call 911 with any severe reactions post vaccine: Marland Kitchen Difficulty breathing  . Swelling of face and throat  . A fast heartbeat  . A bad rash all over body  . Dizziness and weakness   Immunizations Administered    Name Date Dose VIS Date Route   Pfizer COVID-19 Vaccine 08/14/2019  4:03 PM 0.3 mL 06/17/2018 Intramuscular   Manufacturer: [employer], [employer]   Lot: [id number]   [place]: [id number]

## 2019-08-21 ENCOUNTER — Ambulatory Visit: Payer: Commercial Managed Care - PPO | Admitting: Family Medicine

## 2019-08-21 ENCOUNTER — Encounter: Payer: Self-pay | Admitting: Family Medicine

## 2019-08-21 ENCOUNTER — Other Ambulatory Visit: Payer: Self-pay

## 2019-08-21 VITALS — BP 130/78 | HR 72 | Temp 98.1°F | Ht 70.0 in | Wt 164.0 lb

## 2019-08-21 DIAGNOSIS — K1120 Sialoadenitis, unspecified: Secondary | ICD-10-CM

## 2019-08-21 DIAGNOSIS — H6123 Impacted cerumen, bilateral: Secondary | ICD-10-CM | POA: Diagnosis not present

## 2019-08-21 MED ORDER — AMOXICILLIN-POT CLAVULANATE 875-125 MG PO TABS: 1.0000 | ORAL_TABLET | Freq: Two times a day (BID) | ORAL | 0 refills | Status: DC

## 2019-08-21 NOTE — Progress Notes (Signed)
Date:  08/21/2019   Name:  Jordan Morton   DOB:  05-28-1955   MRN:  [id number]   Chief Complaint: Sore Throat (throat hurts on L) side and L) ear hurts.feel tired and has headache)  Sore Throat  This is a new problem. The current episode started in the past 7 days (onset Monday). The problem has been gradually worsening. The pain is worse on the left side. There has been no fever. Associated symptoms include ear pain. Pertinent negatives include no abdominal pain, congestion, coughing, diarrhea, drooling, ear discharge, headaches, hoarse voice, plugged ear sensation, neck pain, shortness of breath, stridor, swollen glands, trouble swallowing or vomiting. He has tried NSAIDs and acetaminophen for the symptoms. The treatment provided no relief.    Lab Results  Component Value Date   CREATININE 0.84 04/28/2019   BUN 10 04/28/2019   NA 139 04/28/2019   K 4.8 04/28/2019   CL 99 04/28/2019   CO2 26 04/28/2019   Lab Results  Component Value Date   CHOL 170 04/28/2019   HDL 70 04/28/2019   LDLCALC 90 04/28/2019   TRIG 49 04/28/2019   CHOLHDL 3.1 04/02/2018   No results found for: TSH No results found for: HGBA1C No results found for: WBC, HGB, HCT, MCV, PLT No results found for: ALT, AST, GGT, ALKPHOS, BILITOT   Review of Systems  Constitutional: Negative for chills and fever.  HENT: Positive for ear pain. Negative for congestion, drooling, ear discharge, hoarse voice, postnasal drip, sore throat and trouble swallowing.   Eyes: Negative for pain.  Respiratory: Negative for cough, chest tightness, shortness of breath, wheezing and stridor.   Cardiovascular: Negative for chest pain, palpitations and leg swelling.  Gastrointestinal: Negative for abdominal pain, blood in stool, constipation, diarrhea, nausea and vomiting.  Endocrine: Negative for polydipsia.  Genitourinary: Negative for dysuria, frequency, hematuria and urgency.  Musculoskeletal: Negative for back pain, myalgias  and neck pain.  Skin: Negative for rash.  Allergic/Immunologic: Negative for environmental allergies.  Neurological: Negative for dizziness and headaches.  Hematological: Does not bruise/bleed easily.  Psychiatric/Behavioral: Negative for suicidal ideas. The patient is not nervous/anxious.     Patient Active Problem List   Diagnosis Date Noted  . Essential hypertension 05/22/2016  . Depression, major, single episode, mild ([place]) 05/22/2016  . Hyperlipidemia 05/22/2016  . Nocturia 05/22/2016    No Known Allergies  Past Surgical History:  Procedure Laterality Date  . COLONOSCOPY  2014   repeat in 5 years  . COLONOSCOPY WITH PROPOFOL N/A 04/08/2018   Procedure: COLONOSCOPY WITH PROPOFOL;  Surgeon: Jonathon Bellows, MD;  Location: [facility] ENDOSCOPY;  Service: Gastroenterology;  Laterality: N/A;  . TONSILLECTOMY      Social History   Tobacco Use  . Smoking status: Current Every Day Smoker    Types: Cigarettes  . Smokeless tobacco: Current User    Types: Snuff  Substance Use Topics  . Alcohol use: Yes    Alcohol/week: 0.0 standard drinks  . Drug use: No     Medication list has been reviewed and updated.  Current Meds  Medication Sig  . aspirin EC 81 MG tablet Take 1 tablet (81 mg total) by mouth daily.  Marland Kitchen lisinopril-hydrochlorothiazide (ZESTORETIC) 10-12.5 MG tablet Take 1 tablet by mouth daily.  . sertraline (ZOLOFT) 50 MG tablet Take 1 tablet (50 mg total) by mouth daily.    PHQ 2/9 Scores 08/21/2019 05/29/2019 04/28/2019 11/20/2018  PHQ - 2 Score 0 0 0 0  PHQ- 9 Score  1 0 0 0    BP Readings from Last 3 Encounters:  08/21/19 130/78  05/29/19 140/70  04/28/19 140/88    Physical Exam Vitals and nursing note reviewed.  HENT:     Head: Normocephalic.     Jaw: There is normal jaw occlusion.     Salivary Glands: Left salivary gland is diffusely enlarged and tender.     Right Ear: External ear normal. There is impacted cerumen.     Left Ear: External ear normal. There is  impacted cerumen.     Nose: Nose normal.     Mouth/Throat:     Lips: Pink.     Mouth: Mucous membranes are moist.     Tongue: No lesions.     Pharynx: Uvula midline. Posterior oropharyngeal erythema present. No pharyngeal swelling, oropharyngeal exudate or uvula swelling.     Tonsils: No tonsillar exudate or tonsillar abscesses.  Eyes:     General: No scleral icterus.       Right eye: No discharge.        Left eye: No discharge.     Conjunctiva/sclera: Conjunctivae normal.     Pupils: Pupils are equal, round, and reactive to light.  Neck:     Thyroid: No thyroid mass, thyromegaly or thyroid tenderness.     Vascular: No JVD.     Trachea: Trachea normal. No tracheal deviation.  Cardiovascular:     Rate and Rhythm: Normal rate and regular rhythm.     Heart sounds: Normal heart sounds. No murmur. No friction rub. No gallop.   Pulmonary:     Effort: No respiratory distress.     Breath sounds: Normal breath sounds. No wheezing or rales.  Abdominal:     General: Bowel sounds are normal.     Palpations: Abdomen is soft. There is no mass.     Tenderness: There is no abdominal tenderness. There is no guarding or rebound.  Musculoskeletal:        General: No tenderness. Normal range of motion.     Cervical back: Full passive range of motion without pain, normal range of motion and neck supple.  Lymphadenopathy:     Head:     Right side of head: No submental or submandibular adenopathy.     Left side of head: Submandibular adenopathy present. No submental adenopathy.     Cervical: No cervical adenopathy.     Right cervical: No superficial, deep or posterior cervical adenopathy.    Left cervical: No superficial, deep or posterior cervical adenopathy.  Skin:    General: Skin is warm.     Findings: No rash.  Neurological:     Mental Status: He is alert and oriented to person, place, and time.     Cranial Nerves: No cranial nerve deficit.     Deep Tendon Reflexes: Reflexes are normal and  symmetric.     Wt Readings from Last 3 Encounters:  08/21/19 164 lb (74.4 kg)  05/29/19 164 lb (74.4 kg)  04/28/19 165 lb (74.8 kg)    BP 130/78   Pulse 72   Temp 98.1 F (36.7 C) (Oral)   Ht 5\' 10"  (1.778 m)   Wt 164 lb (74.4 kg)   BMI 23.53 kg/m   Assessment and Plan:  1. Parotitis Area of firmness and tenderness under the left submandibular area consistent with a infection of the parotid gland on the left.  This may be adenopathy but I think it is more likely due to an infection of  the parotid gland.  We will initiate Augmentin 875 mg twice a day for 10 days patient has strict instructions that if he has not totally recovered that I need to recheck for complete resolution.  Patient does use oral tobacco and is a smoker and I have concerns if not resolved. - amoxicillin-clavulanate (AUGMENTIN) 875-125 MG tablet; Take 1 tablet by mouth 2 (two) times daily.  Dispense: 20 tablet; Refill: 0  2. Bilateral impacted cerumen Patient has bilateral impaction noted.  Patient thinks he has some Debrox at home and will begin work on his ears along with using a bulb syringe.

## 2019-08-24 ENCOUNTER — Other Ambulatory Visit: Payer: Self-pay

## 2019-08-24 ENCOUNTER — Ambulatory Visit
Admission: EM | Admit: 2019-08-24 | Discharge: 2019-08-24 | Disposition: A | Discharge status: Home / Self Care | Payer: Commercial Managed Care - PPO | Attending: Family Medicine | Admitting: Family Medicine

## 2019-08-24 DIAGNOSIS — J029 Acute pharyngitis, unspecified: Secondary | ICD-10-CM

## 2019-08-24 DIAGNOSIS — R590 Localized enlarged lymph nodes: Secondary | ICD-10-CM

## 2019-08-24 NOTE — Discharge Instructions (Signed)
Continue current antibiotic (Augmentin) Follow up with ENT if no improvement or any worsening

## 2019-08-24 NOTE — ED Triage Notes (Signed)
Pt reports sore throat x 5 days.  Pt was seen by another provider and given Amoxicillin.  Has been taking it x 3 days.  Reports increased pain and swelling in left jaw.  Pt reports spitting up blood starting this morning.  Moderate swelling noted to L jaw line.  Reports increased pain with swalling, no pain with chewing.   Denies cough or SOB.   Pt cleared throat and spit into tissue during triage showing pink-tinged phlegm.

## 2019-08-24 NOTE — ED Provider Notes (Signed)
[facility]    CSN: [id number] Arrival date & time: 08/24/19  0932      History   Chief Complaint Chief Complaint  Patient presents with  . Sore Throat    HPI Jordan Morton is a 64 y.o. male.   64 yo male with a c/o sore throat for 5 days. Was recently seen by PCP and started on amoxicillin 3 days ago. Patient states that he was not noticing improvement until this morning. States currently feels a little better but noticed some blood in his spit. Denies coughing up blood. Denies any fevers, chills, shortness of breath, or difficulty swallowing.       Past Medical History:  Diagnosis Date  . Depression   . Hypertension     Patient Active Problem List   Diagnosis Date Noted  . Essential hypertension 05/22/2016  . Depression, major, single episode, mild ([place]) 05/22/2016  . Hyperlipidemia 05/22/2016  . Nocturia 05/22/2016    Past Surgical History:  Procedure Laterality Date  . COLONOSCOPY  2014   repeat in 5 years  . COLONOSCOPY WITH PROPOFOL N/A 04/08/2018   Procedure: COLONOSCOPY WITH PROPOFOL;  Surgeon: Jonathon Bellows, MD;  Location: [facility] ENDOSCOPY;  Service: Gastroenterology;  Laterality: N/A;  . TONSILLECTOMY         Home Medications    Prior to Admission medications   Medication Sig Start Date End Date Taking? Authorizing Provider  amoxicillin-clavulanate (AUGMENTIN) 875-125 MG tablet Take 1 tablet by mouth 2 (two) times daily. 08/21/19  Yes Juline Patch, MD  aspirin EC 81 MG tablet Take 1 tablet (81 mg total) by mouth daily. 04/28/19  Yes Juline Patch, MD  lisinopril-hydrochlorothiazide (ZESTORETIC) 10-12.5 MG tablet Take 1 tablet by mouth daily. 04/28/19  Yes Juline Patch, MD  sertraline (ZOLOFT) 50 MG tablet Take 1 tablet (50 mg total) by mouth daily. 04/28/19  Yes Juline Patch, MD    Family History Family History  Problem Relation Age of Onset  . Cancer Mother   . Cancer Father     Social History Social History   Tobacco Use    . Smoking status: Current Every Day Smoker    Packs/day: 1.00    Years: 45.00    Pack years: 45.00    Types: Cigarettes  . Smokeless tobacco: Current User    Types: Snuff  Substance Use Topics  . Alcohol use: Yes    Alcohol/week: 56.0 standard drinks    Types: 28 Cans of beer, 28 Standard drinks or equivalent per week  . Drug use: No     Allergies   Patient has no known allergies.   Review of Systems Review of Systems   Physical Exam Triage Vital Signs ED Triage Vitals  Enc Vitals Group     BP 08/24/19 1019 (!) 148/88     Pulse Rate 08/24/19 1019 (!) 104     Resp 08/24/19 1019 20     Temp 08/24/19 1019 98.4 F (36.9 C)     Temp Source 08/24/19 1019 Oral     SpO2 08/24/19 1019 98 %     Weight --      Height --      Head Circumference --      Peak Flow --      Pain Score 08/24/19 1015 6     Pain Loc --      Pain Edu? --      Excl. in [place]? --    No data found.  Updated Vital Signs BP (!) 148/88 (BP Location: Left Arm)   Pulse (!) 104   Temp 98.4 F (36.9 C) (Oral)   Resp 20   SpO2 98%   Visual Acuity Right Eye Distance:   Left Eye Distance:   Bilateral Distance:    Right Eye Near:   Left Eye Near:    Bilateral Near:     Physical Exam Vitals and nursing note reviewed.  Constitutional:      General: He is not in acute distress.    Appearance: He is not toxic-appearing or diaphoretic.  HENT:     Right Ear: Tympanic membrane normal.     Left Ear: Tympanic membrane normal.     Nose: No congestion or rhinorrhea.     Mouth/Throat:     Pharynx: Posterior oropharyngeal erythema present. No oropharyngeal exudate.     Comments: Left submandibular tender adenopathy Pulmonary:     Effort: Pulmonary effort is normal. No respiratory distress.  Musculoskeletal:     Cervical back: Normal range of motion and neck supple.  Neurological:     Mental Status: He is alert.      UC Treatments / Results  Labs (all labs ordered are listed, but only abnormal  results are displayed) Labs Reviewed - No data to display  EKG   Radiology No results found.  Procedures Procedures (including critical care time)  Medications Ordered in UC Medications - No data to display  Initial Impression / Assessment and Plan / UC Course  I have reviewed the triage vital signs and the nursing notes.  Pertinent labs & imaging results that were available during my care of the patient were reviewed by me and considered in my medical decision making (see chart for details).     Final Clinical Impressions(s) / UC Diagnoses   Final diagnoses:  Pharyngitis, unspecified etiology  Submandibular lymphadenopathy     Discharge Instructions     Continue current antibiotic (Augmentin) Follow up with ENT if no improvement or any worsening    ED Prescriptions    None      1. Lab results and diagnosis reviewed with patient 2. Continue current antibiotic 3. Recommend supportive treatment with otc analgesics  4. Follow-up prn if symptoms worsen or don't improve  PDMP not reviewed this encounter.   Norval Gable, MD 08/31/19 1159

## 2019-10-14 ENCOUNTER — Encounter: Payer: Self-pay | Admitting: Family Medicine

## 2019-10-14 ENCOUNTER — Ambulatory Visit: Payer: Commercial Managed Care - PPO | Admitting: Family Medicine

## 2019-10-14 ENCOUNTER — Other Ambulatory Visit: Payer: Self-pay

## 2019-10-14 VITALS — BP 138/82 | HR 87 | Ht 70.0 in | Wt 162.0 lb

## 2019-10-14 DIAGNOSIS — F331 Major depressive disorder, recurrent, moderate: Secondary | ICD-10-CM

## 2019-10-14 DIAGNOSIS — I10 Essential (primary) hypertension: Secondary | ICD-10-CM

## 2019-10-14 MED ORDER — SERTRALINE HCL 50 MG PO TABS: 50.0000 mg | ORAL_TABLET | Freq: Every day | ORAL | 1 refills | Status: DC

## 2019-10-14 MED ORDER — LISINOPRIL-HYDROCHLOROTHIAZIDE 10-12.5 MG PO TABS: 1.0000 | ORAL_TABLET | Freq: Every day | ORAL | 1 refills | Status: DC

## 2019-10-14 NOTE — Progress Notes (Signed)
Date:  10/14/2019   Name:  Jordan Morton   DOB:  1955-05-11   MRN:  [id number]   Chief Complaint: Hypertension (follow up / Med RF) and Depression  Hypertension This is a chronic problem. The current episode started more than 1 year ago. The problem has been waxing and waning since onset. The problem is controlled. Pertinent negatives include no anxiety, blurred vision, chest pain, headaches, malaise/fatigue, neck pain, orthopnea, palpitations, peripheral edema, PND, shortness of breath or sweats. There are no associated agents to hypertension. There are no known risk factors for coronary artery disease. Past treatments include ACE inhibitors and diuretics. The current treatment provides moderate improvement. There are no compliance problems.  There is no history of angina, kidney disease, CAD/MI, CVA, heart failure, left ventricular hypertrophy, PVD or retinopathy. There is no history of chronic renal disease, a hypertension causing med or renovascular disease.  Depression        This is a chronic problem.  The current episode started more than 1 year ago.   The onset quality is gradual.   The problem occurs intermittently.  Associated symptoms include no decreased concentration, no fatigue, no helplessness, no hopelessness, does not have insomnia, not irritable, no restlessness, no decreased interest, no appetite change, no body aches, no myalgias, no headaches, no indigestion, not sad and no suicidal ideas.     The symptoms are aggravated by nothing.  Past treatments include SSRIs - Selective serotonin reuptake inhibitors.  Compliance with treatment is variable.  Previous treatment provided moderate relief.   Pertinent negatives include no anxiety.   Lab Results  Component Value Date   CREATININE 0.84 04/28/2019   BUN 10 04/28/2019   NA 139 04/28/2019   K 4.8 04/28/2019   CL 99 04/28/2019   CO2 26 04/28/2019   Lab Results  Component Value Date   CHOL 170 04/28/2019   HDL 70  04/28/2019   LDLCALC 90 04/28/2019   TRIG 49 04/28/2019   CHOLHDL 3.1 04/02/2018   No results found for: TSH No results found for: HGBA1C No results found for: WBC, HGB, HCT, MCV, PLT No results found for: ALT, AST, GGT, ALKPHOS, BILITOT   Review of Systems  Constitutional: Negative for appetite change, chills, fatigue, fever and malaise/fatigue.  HENT: Negative for drooling, ear discharge, ear pain and sore throat.   Eyes: Negative for blurred vision.  Respiratory: Negative for cough, shortness of breath and wheezing.   Cardiovascular: Negative for chest pain, palpitations, orthopnea, leg swelling and PND.  Gastrointestinal: Negative for abdominal pain, blood in stool, constipation, diarrhea and nausea.  Endocrine: Negative for polydipsia.  Genitourinary: Negative for dysuria, frequency, hematuria and urgency.  Musculoskeletal: Negative for back pain, myalgias and neck pain.  Skin: Negative for rash.  Allergic/Immunologic: Negative for environmental allergies.  Neurological: Negative for dizziness and headaches.  Hematological: Does not bruise/bleed easily.  Psychiatric/Behavioral: Positive for depression. Negative for decreased concentration and suicidal ideas. The patient is not nervous/anxious and does not have insomnia.     Patient Active Problem List   Diagnosis Date Noted  . Essential hypertension 05/22/2016  . Depression, major, single episode, mild ([place]) 05/22/2016  . Hyperlipidemia 05/22/2016  . Nocturia 05/22/2016    No Known Allergies  Past Surgical History:  Procedure Laterality Date  . COLONOSCOPY  2014   repeat in 5 years  . COLONOSCOPY WITH PROPOFOL N/A 04/08/2018   Procedure: COLONOSCOPY WITH PROPOFOL;  Surgeon: Jonathon Bellows, MD;  Location: [facility] ENDOSCOPY;  Service:  Gastroenterology;  Laterality: N/A;  . TONSILLECTOMY      Social History   Tobacco Use  . Smoking status: Current Every Day Smoker    Packs/day: 1.00    Years: 45.00    Pack years:  45.00    Types: Cigarettes  . Smokeless tobacco: Current User    Types: Snuff  Vaping Use  . Vaping Use: Never used  Substance Use Topics  . Alcohol use: Yes    Alcohol/week: 56.0 standard drinks    Types: 28 Cans of beer, 28 Standard drinks or equivalent per week  . Drug use: No     Medication list has been reviewed and updated.  Current Meds  Medication Sig  . aspirin EC 81 MG tablet Take 1 tablet (81 mg total) by mouth daily.  Marland Kitchen lisinopril-hydrochlorothiazide (ZESTORETIC) 10-12.5 MG tablet Take 1 tablet by mouth daily.  . Multiple Vitamin (MULTIVITAMIN) tablet Take 1 tablet by mouth daily.  . sertraline (ZOLOFT) 50 MG tablet Take 1 tablet (50 mg total) by mouth daily.    PHQ 2/9 Scores 10/14/2019 08/21/2019 05/29/2019 04/28/2019  PHQ - 2 Score 0 0 0 0  PHQ- 9 Score 0 1 0 0    GAD 7 : Generalized Anxiety Score 10/14/2019 08/21/2019 05/29/2019 04/28/2019  Nervous, Anxious, on Edge 0 0 0 0  Control/stop worrying 0 0 0 0  Worry too much - different things 0 0 0 0  Trouble relaxing 0 0 0 0  Restless 0 0 0 0  Easily annoyed or irritable 0 0 0 0  Afraid - awful might happen 0 0 0 0  Total GAD 7 Score 0 0 0 0  Anxiety Difficulty Not difficult at all - - -    BP Readings from Last 3 Encounters:  10/14/19 (!) 150/84  08/24/19 (!) 148/88  08/21/19 130/78    Physical Exam Vitals reviewed.  Constitutional:      General: He is not irritable. HENT:     Head: Normocephalic.     Right Ear: Tympanic membrane, ear canal and external ear normal.     Left Ear: Tympanic membrane, ear canal and external ear normal.     Nose: Nose normal.  Eyes:     General: No scleral icterus.       Right eye: No discharge.        Left eye: No discharge.     Conjunctiva/sclera: Conjunctivae normal.     Pupils: Pupils are equal, round, and reactive to light.  Neck:     Thyroid: No thyromegaly.     Vascular: No JVD.     Trachea: No tracheal deviation.  Cardiovascular:     Rate and Rhythm: Normal rate  and regular rhythm.     Chest Wall: PMI is not displaced.     Pulses: Normal pulses.     Heart sounds: Normal heart sounds, S1 normal and S2 normal. No murmur heard.  No systolic murmur is present.  No diastolic murmur is present.  No friction rub. No gallop. No S3 or S4 sounds.   Pulmonary:     Effort: No respiratory distress.     Breath sounds: Normal breath sounds. No wheezing or rales.  Abdominal:     General: Bowel sounds are normal.     Palpations: Abdomen is soft. There is no mass.     Tenderness: There is no abdominal tenderness. There is no guarding or rebound.  Musculoskeletal:        General: No tenderness. Normal  range of motion.     Cervical back: Normal range of motion and neck supple.     Right lower leg: No edema.     Left lower leg: No edema.  Lymphadenopathy:     Cervical: No cervical adenopathy.  Skin:    General: Skin is warm.     Findings: No rash.  Neurological:     Mental Status: He is alert and oriented to person, place, and time.     Cranial Nerves: No cranial nerve deficit.     Deep Tendon Reflexes: Reflexes are normal and symmetric.     Wt Readings from Last 3 Encounters:  10/14/19 162 lb (73.5 kg)  08/21/19 164 lb (74.4 kg)  05/29/19 164 lb (74.4 kg)    BP (!) 150/84   Pulse 87   Ht 5\' 10"  (1.778 m)   Wt 162 lb (73.5 kg)   SpO2 100%   BMI 23.24 kg/m   Assessment and Plan:  1. Essential hypertension Chronic.  Controlled.  Stable.  On recheck 138/82.  Continue lisinopril hydrochlorothiazide 10-12 0.5 once a day.  Reviewed renal function panel in January was noted to be in normal limits.  Will recheck in 6 months - lisinopril-hydrochlorothiazide (ZESTORETIC) 10-12.5 MG tablet; Take 1 tablet by mouth daily.  Dispense: 90 tablet; Refill: 1  2. Moderate episode of recurrent major depressive disorder (HCC) Chronic.  Moderate.  Recurrent but controlled with medication..  Stable with patient returning to some resemblance of life before loss of  spouse..  Patient doing well on medication we will continue given circumstances.  We will continue Zoloft 50 mg once a day.- sertraline (ZOLOFT) 50 MG tablet; Take 1 tablet (50 mg total) by mouth daily.  Dispense: 90 tablet; Refill: 1

## 2020-02-03 LAB — BASIC METABOLIC PANEL: Glucose: 92

## 2020-02-03 LAB — LIPID PANEL
Cholesterol: 124 (ref 0–200)
HDL: 51 (ref 35–70)
Triglycerides: 145 (ref 40–160)
Triglycerides: 45 (ref 40–160)

## 2020-02-23 ENCOUNTER — Encounter: Payer: Self-pay | Admitting: Family Medicine

## 2020-02-23 ENCOUNTER — Ambulatory Visit: Payer: Commercial Managed Care - PPO | Admitting: Family Medicine

## 2020-02-23 ENCOUNTER — Other Ambulatory Visit: Payer: Self-pay

## 2020-02-23 VITALS — BP 130/70 | HR 100 | Ht 70.0 in | Wt 164.0 lb

## 2020-02-23 DIAGNOSIS — E7801 Familial hypercholesterolemia: Secondary | ICD-10-CM | POA: Diagnosis not present

## 2020-02-23 DIAGNOSIS — L570 Actinic keratosis: Secondary | ICD-10-CM | POA: Diagnosis not present

## 2020-02-23 DIAGNOSIS — I10 Essential (primary) hypertension: Secondary | ICD-10-CM | POA: Diagnosis not present

## 2020-02-23 DIAGNOSIS — F331 Major depressive disorder, recurrent, moderate: Secondary | ICD-10-CM

## 2020-02-23 DIAGNOSIS — Z23 Encounter for immunization: Secondary | ICD-10-CM

## 2020-02-23 MED ORDER — LISINOPRIL-HYDROCHLOROTHIAZIDE 10-12.5 MG PO TABS: 1.0000 | ORAL_TABLET | Freq: Every day | ORAL | 1 refills | Status: DC

## 2020-02-23 MED ORDER — SERTRALINE HCL 50 MG PO TABS: 50.0000 mg | ORAL_TABLET | Freq: Every day | ORAL | 1 refills | Status: DC

## 2020-02-23 NOTE — Progress Notes (Signed)
Date:  02/23/2020   Name:  Jordan Morton   DOB:  29-Nov-1955   MRN:  [id number]   Chief Complaint: Hypertension, Hyperlipidemia, Depression, derm referral, and Flu Vaccine  Hypertension This is a chronic problem. The current episode started more than 1 year ago. The problem has been gradually improving since onset. The problem is controlled. Pertinent negatives include no anxiety, blurred vision, chest pain, headaches, malaise/fatigue, neck pain, orthopnea, palpitations, peripheral edema, PND, shortness of breath or sweats. There are no associated agents to hypertension. Risk factors for coronary artery disease include dyslipidemia. Past treatments include ACE inhibitors and diuretics. The current treatment provides moderate improvement. There are no compliance problems.  There is no history of angina, kidney disease, CAD/MI, CVA, heart failure, left ventricular hypertrophy, PVD or retinopathy. There is no history of chronic renal disease, a hypertension causing med or renovascular disease.  Hyperlipidemia The current episode started more than 1 year ago. The problem is controlled. Recent lipid tests were reviewed and are normal. He has no history of chronic renal disease. Pertinent negatives include no chest pain, myalgias or shortness of breath. Current antihyperlipidemic treatment includes statins. The current treatment provides moderate improvement of lipids. There are no compliance problems.   Depression        The current episode started more than 1 year ago.   The problem occurs intermittently.  The problem has been gradually improving since onset.  Associated symptoms include no decreased concentration, no fatigue, no helplessness, no hopelessness, does not have insomnia, not irritable, no restlessness, no decreased interest, no appetite change, no body aches, no myalgias, no headaches, no indigestion, not sad and no suicidal ideas.     The symptoms are aggravated by work stress.  Past  treatments include SSRIs - Selective serotonin reuptake inhibitors.  Compliance with treatment is variable.  Previous treatment provided moderate relief.   Pertinent negatives include no anxiety.   Lab Results  Component Value Date   CREATININE 0.84 04/28/2019   BUN 10 04/28/2019   NA 139 04/28/2019   K 4.8 04/28/2019   CL 99 04/28/2019   CO2 26 04/28/2019   Lab Results  Component Value Date   CHOL 124 02/03/2020   HDL 51 02/03/2020   LDLCALC 90 04/28/2019   TRIG 145 02/03/2020   TRIG 45 02/03/2020   CHOLHDL 3.1 04/02/2018   No results found for: TSH No results found for: HGBA1C No results found for: WBC, HGB, HCT, MCV, PLT No results found for: ALT, AST, GGT, ALKPHOS, BILITOT   Review of Systems  Constitutional: Negative for appetite change, chills, fatigue, fever and malaise/fatigue.  HENT: Negative for drooling, ear discharge, ear pain and sore throat.   Eyes: Negative for blurred vision.  Respiratory: Negative for cough, shortness of breath and wheezing.   Cardiovascular: Negative for chest pain, palpitations, orthopnea, leg swelling and PND.  Gastrointestinal: Negative for abdominal pain, blood in stool, constipation, diarrhea and nausea.  Endocrine: Negative for polydipsia.  Genitourinary: Negative for dysuria, frequency, hematuria and urgency.  Musculoskeletal: Negative for back pain, myalgias and neck pain.  Skin: Negative for rash.  Allergic/Immunologic: Negative for environmental allergies.  Neurological: Negative for dizziness and headaches.  Hematological: Does not bruise/bleed easily.  Psychiatric/Behavioral: Positive for depression. Negative for decreased concentration and suicidal ideas. The patient is not nervous/anxious and does not have insomnia.     Patient Active Problem List   Diagnosis Date Noted  . Essential hypertension 05/22/2016  . Depression, major, single episode,  mild ([place]) 05/22/2016  . Hyperlipidemia 05/22/2016  . Nocturia 05/22/2016     No Known Allergies  Past Surgical History:  Procedure Laterality Date  . COLONOSCOPY  2014   repeat in 5 years  . COLONOSCOPY WITH PROPOFOL N/A 04/08/2018   Procedure: COLONOSCOPY WITH PROPOFOL;  Surgeon: Jonathon Bellows, MD;  Location: [facility] ENDOSCOPY;  Service: Gastroenterology;  Laterality: N/A;  . TONSILLECTOMY      Social History   Tobacco Use  . Smoking status: Current Every Day Smoker    Packs/day: 1.00    Years: 45.00    Pack years: 45.00    Types: Cigarettes  . Smokeless tobacco: Current User    Types: Snuff  Vaping Use  . Vaping Use: Never used  Substance Use Topics  . Alcohol use: Yes    Alcohol/week: 56.0 standard drinks    Types: 28 Cans of beer, 28 Standard drinks or equivalent per week  . Drug use: No     Medication list has been reviewed and updated.  Current Meds  Medication Sig  . aspirin EC 81 MG tablet Take 1 tablet (81 mg total) by mouth daily.  Marland Kitchen lisinopril-hydrochlorothiazide (ZESTORETIC) 10-12.5 MG tablet Take 1 tablet by mouth daily.  . Multiple Vitamin (MULTIVITAMIN) tablet Take 1 tablet by mouth daily.  . sertraline (ZOLOFT) 50 MG tablet Take 1 tablet (50 mg total) by mouth daily.    PHQ 2/9 Scores 02/23/2020 10/14/2019 08/21/2019 05/29/2019  PHQ - 2 Score 0 0 0 0  PHQ- 9 Score 0 0 1 0    GAD 7 : Generalized Anxiety Score 02/23/2020 10/14/2019 08/21/2019 05/29/2019  Nervous, Anxious, on Edge 0 0 0 0  Control/stop worrying 0 0 0 0  Worry too much - different things 0 0 0 0  Trouble relaxing 0 0 0 0  Restless 0 0 0 0  Easily annoyed or irritable 0 0 0 0  Afraid - awful might happen 0 0 0 0  Total GAD 7 Score 0 0 0 0  Anxiety Difficulty - Not difficult at all - -    BP Readings from Last 3 Encounters:  02/23/20 130/70  10/14/19 138/82  08/24/19 (!) 148/88    Physical Exam Vitals and nursing note reviewed.  Constitutional:      General: He is not irritable. HENT:     Head: Normocephalic.     Right Ear: Tympanic membrane and external  ear normal.     Left Ear: Tympanic membrane and external ear normal.     Nose: Nose normal. No congestion or rhinorrhea.     Mouth/Throat:     Mouth: Mucous membranes are moist.  Eyes:     General: No scleral icterus.       Right eye: No discharge.        Left eye: No discharge.     Conjunctiva/sclera: Conjunctivae normal.     Pupils: Pupils are equal, round, and reactive to light.  Neck:     Thyroid: No thyromegaly.     Vascular: No JVD.     Trachea: No tracheal deviation.  Cardiovascular:     Rate and Rhythm: Normal rate and regular rhythm.     Heart sounds: Normal heart sounds. No murmur heard.  No friction rub. No gallop.   Pulmonary:     Effort: No respiratory distress.     Breath sounds: Normal breath sounds. No wheezing, rhonchi or rales.  Abdominal:     General: Bowel sounds are normal.  Palpations: Abdomen is soft. There is no mass.     Tenderness: There is no abdominal tenderness. There is no guarding or rebound.  Musculoskeletal:        General: No tenderness. Normal range of motion.     Cervical back: Normal range of motion and neck supple.  Lymphadenopathy:     Cervical: No cervical adenopathy.  Skin:    General: Skin is warm.     Findings: No rash.  Neurological:     Mental Status: He is alert and oriented to person, place, and time.     Cranial Nerves: No cranial nerve deficit.     Deep Tendon Reflexes: Reflexes are normal and symmetric.     Wt Readings from Last 3 Encounters:  02/23/20 164 lb (74.4 kg)  10/14/19 162 lb (73.5 kg)  08/21/19 164 lb (74.4 kg)    BP 130/70   Pulse 100   Ht 5\' 10"  (1.778 m)   Wt 164 lb (74.4 kg)   BMI 23.53 kg/m   Assessment and Plan: 1. Essential hypertension Chronic.  Controlled.  Stable.  Continue lisinopril hydrochlorothiazide 10-12.5 mg once a day. - lisinopril-hydrochlorothiazide (ZESTORETIC) 10-12.5 MG tablet; Take 1 tablet by mouth daily.  Dispense: 90 tablet; Refill: 1  2. Moderate episode of recurrent  major depressive disorder (HCC) Chronic.  Controlled.  Stable.  PHQ is 0 Gad score is 0.  Continue sertraline 50 mg 1 tablet daily. - sertraline (ZOLOFT) 50 MG tablet; Take 1 tablet (50 mg total) by mouth daily.  Dispense: 90 tablet; Refill: 1  3. Familial hypercholesterolemia Chronic.  Controlled.  Stable.  Continue dietary control of LDL which is mildly elevated on previous laboratory evaluations.  4. Actinic keratoses Chronic.  Persistent.  Patient has multiple areas consistent with actinic keratoses previously treated by dermatology/Dr. Phillip Heal.  Will refer back to Dr. Phillip Heal for further evaluation.  There was an area that was biopsy that needed to be further excised at which time this will be reevaluated. - Ambulatory referral to Dermatology  5. Need for immunization against influenza Discussed and administered. - Flu Vaccine QUAD 36+ mos IM

## 2020-04-11 ENCOUNTER — Ambulatory Visit: Payer: Commercial Managed Care - PPO | Admitting: Family Medicine

## 2020-04-11 ENCOUNTER — Other Ambulatory Visit: Payer: Self-pay

## 2020-04-11 ENCOUNTER — Encounter: Payer: Self-pay | Admitting: Family Medicine

## 2020-04-11 VITALS — BP 130/80 | HR 80 | Ht 70.0 in | Wt 162.0 lb

## 2020-04-11 DIAGNOSIS — K115 Sialolithiasis: Secondary | ICD-10-CM | POA: Diagnosis not present

## 2020-04-11 NOTE — Progress Notes (Signed)
Date:  04/11/2020   Name:  Jordan Morton   DOB:  1955/04/24   MRN:  [id number]   Chief Complaint: salivary stones (Has had 3 stones since April of this year. The last stone caused throat to hurt for 1 week and then he was able to get stone out- they occur on the left side/ bed of mouth)  Sore Throat  This is a chronic (salavary stones recurrent) problem. The problem has been waxing and waning. The pain is worse on the left side. There has been no fever. The pain is moderate. Associated symptoms include neck pain and swollen glands. Pertinent negatives include no abdominal pain, congestion, coughing, diarrhea, drooling, ear discharge, ear pain, headaches, hoarse voice, plugged ear sensation, shortness of breath, stridor, trouble swallowing or vomiting. Associated symptoms comments: Currently stable. Treatments tried: eventually passes on own. The treatment provided moderate relief.    Lab Results  Component Value Date   CREATININE 0.84 04/28/2019   BUN 10 04/28/2019   NA 139 04/28/2019   K 4.8 04/28/2019   CL 99 04/28/2019   CO2 26 04/28/2019   Lab Results  Component Value Date   CHOL 124 02/03/2020   HDL 51 02/03/2020   LDLCALC 90 04/28/2019   TRIG 145 02/03/2020   TRIG 45 02/03/2020   CHOLHDL 3.1 04/02/2018   No results found for: TSH No results found for: HGBA1C No results found for: WBC, HGB, HCT, MCV, PLT No results found for: ALT, AST, GGT, ALKPHOS, BILITOT   Review of Systems  Constitutional: Negative for chills and fever.  HENT: Negative for congestion, drooling, ear discharge, ear pain, hoarse voice, postnasal drip, rhinorrhea, sore throat and trouble swallowing.   Respiratory: Negative for cough, shortness of breath, wheezing and stridor.   Cardiovascular: Negative for chest pain, palpitations and leg swelling.  Gastrointestinal: Negative for abdominal pain, blood in stool, constipation, diarrhea, nausea and vomiting.  Endocrine: Negative for polydipsia.   Genitourinary: Negative for dysuria, frequency, hematuria and urgency.  Musculoskeletal: Positive for neck pain. Negative for back pain and myalgias.  Skin: Negative for rash.  Allergic/Immunologic: Negative for environmental allergies.  Neurological: Negative for dizziness and headaches.  Hematological: Does not bruise/bleed easily.  Psychiatric/Behavioral: Negative for suicidal ideas. The patient is not nervous/anxious.     Patient Active Problem List   Diagnosis Date Noted  . Essential hypertension 05/22/2016  . Depression, major, single episode, mild ([place]) 05/22/2016  . Hyperlipidemia 05/22/2016  . Nocturia 05/22/2016    No Known Allergies  Past Surgical History:  Procedure Laterality Date  . COLONOSCOPY  2014   repeat in 5 years  . COLONOSCOPY WITH PROPOFOL N/A 04/08/2018   Procedure: COLONOSCOPY WITH PROPOFOL;  Surgeon: Jonathon Bellows, MD;  Location: [facility] ENDOSCOPY;  Service: Gastroenterology;  Laterality: N/A;  . TONSILLECTOMY      Social History   Tobacco Use  . Smoking status: Current Every Day Smoker    Packs/day: 1.00    Years: 45.00    Pack years: 45.00    Types: Cigarettes  . Smokeless tobacco: Current User    Types: Snuff  Vaping Use  . Vaping Use: Never used  Substance Use Topics  . Alcohol use: Yes    Alcohol/week: 56.0 standard drinks    Types: 28 Cans of beer, 28 Standard drinks or equivalent per week  . Drug use: No     Medication list has been reviewed and updated.  Current Meds  Medication Sig  . aspirin EC 81  MG tablet Take 1 tablet (81 mg total) by mouth daily.  Marland Kitchen lisinopril-hydrochlorothiazide (ZESTORETIC) 10-12.5 MG tablet Take 1 tablet by mouth daily.  . Multiple Vitamin (MULTIVITAMIN) tablet Take 1 tablet by mouth daily.  . sertraline (ZOLOFT) 50 MG tablet Take 1 tablet (50 mg total) by mouth daily.    PHQ 2/9 Scores 04/11/2020 02/23/2020 10/14/2019 08/21/2019  PHQ - 2 Score 0 0 0 0  PHQ- 9 Score 0 0 0 1    GAD 7 : Generalized  Anxiety Score 04/11/2020 02/23/2020 10/14/2019 08/21/2019  Nervous, Anxious, on Edge 0 0 0 0  Control/stop worrying 0 0 0 0  Worry too much - different things 0 0 0 0  Trouble relaxing 0 0 0 0  Restless 0 0 0 0  Easily annoyed or irritable 0 0 0 0  Afraid - awful might happen 0 0 0 0  Total GAD 7 Score 0 0 0 0  Anxiety Difficulty - - Not difficult at all -    BP Readings from Last 3 Encounters:  04/11/20 130/80  02/23/20 130/70  10/14/19 138/82    Physical Exam Vitals and nursing note reviewed.  HENT:     Head: Normocephalic.     Right Ear: Tympanic membrane and external ear normal.     Left Ear: Tympanic membrane and external ear normal.     Nose: Nose normal.     Mouth/Throat:     Mouth: Oropharynx is clear and moist.  Eyes:     General: No scleral icterus.       Right eye: No discharge.        Left eye: No discharge.     Extraocular Movements: EOM normal.     Conjunctiva/sclera: Conjunctivae normal.     Pupils: Pupils are equal, round, and reactive to light.  Neck:     Thyroid: No thyromegaly.     Vascular: No JVD.     Trachea: No tracheal deviation.  Cardiovascular:     Rate and Rhythm: Normal rate and regular rhythm.     Pulses: Intact distal pulses.     Heart sounds: Normal heart sounds. No murmur heard. No friction rub. No gallop.   Pulmonary:     Effort: No respiratory distress.     Breath sounds: Normal breath sounds. No wheezing or rales.  Abdominal:     General: Bowel sounds are normal.     Palpations: Abdomen is soft. There is no hepatosplenomegaly or mass.     Tenderness: There is no abdominal tenderness. There is no CVA tenderness, guarding or rebound.  Musculoskeletal:        General: No tenderness or edema. Normal range of motion.     Cervical back: Normal range of motion and neck supple.  Lymphadenopathy:     Cervical: No cervical adenopathy.  Skin:    General: Skin is warm.     Findings: No rash.  Neurological:     Mental Status: He is alert  and oriented to person, place, and time.     Cranial Nerves: No cranial nerve deficit.     Deep Tendon Reflexes: Strength normal and reflexes are normal and symmetric.     Wt Readings from Last 3 Encounters:  04/11/20 162 lb (73.5 kg)  02/23/20 164 lb (74.4 kg)  10/14/19 162 lb (73.5 kg)    BP 130/80   Pulse 80   Ht 5\' 10"  (1.778 m)   Wt 162 lb (73.5 kg)   BMI 23.24 kg/m  Assessment and Plan:  1. Salivary duct calculi Chronic.  Recurrent.  Episodic.  Patient has been seen over the past year for first parotitis that was treated with Augmentin followed with an episode of pharyngitis each of these however were associated with a salivary stone of which he has had 3-4 episodes since the latter part of April.  The last one he is got a picture of it and has a stone at home.  Given the recurrent nature and the difficulty of of the last 1 being the size of it was and the removal we will refer to ear nose and throat for further evaluation if there is still stones in the duct and if these can be dealt with at the ENT level.  Referral has been made to Dr. Kristine Garbe for evaluation and treatment. - Ambulatory referral to ENT

## 2020-04-11 NOTE — Patient Instructions (Signed)
Salivary Stone  A salivary stone is a mineral deposit that builds up in the ducts that drain your salivary glands. Most salivary stones are made of calcium. When a stone forms, saliva can back up into the gland and cause painful swelling. Your salivary glands are the glands that produce saliva. You have six major salivary glands. Each gland has a duct that carries saliva into your mouth. Saliva keeps your mouth moist and breaks down the food that you eat. It also helps prevent tooth decay. Two salivary glands are located just in front of your ears (parotid). The ducts for these glands open up inside your cheeks, near your back teeth. You also have two glands under your tongue (sublingual) and two glands under your jaw (submandibular). The ducts for these glands open under your tongue. A stone can form in any salivary gland. The most common place for a salivary stone to develop is in a submandibular salivary gland. What are the causes? Salivary stones may be caused by any condition that reduces the flow of saliva. It is not known why some people form stones. What increases the risk? You are more likely to develop this condition if:  You are male.  You do not drink enough water.  You smoke.  You have any of the following: ? High blood pressure. ? Gout. ? Diabetes. What are the signs or symptoms? The main sign of a salivary gland stone is sudden swelling of a salivary gland when eating. This usually happens under the jaw on one side. Other signs and symptoms may include:  Swelling of the cheek or under the tongue when eating.  Pain in the swollen area.  Trouble chewing or swallowing.  Swelling that goes down after eating. How is this diagnosed? This condition may be diagnosed based on:  Your signs and symptoms.  A physical exam. In many cases, your health care provider will be able to feel the stone in a duct inside your mouth.  Imaging studies, such  as: ? X-rays. ? Ultrasound. ? CT scan. ? MRI. You may need to see an ear, nose, and throat specialist (ENT or otolaryngologist) for diagnosis and treatment. How is this treated? Treatment for this condition depends on the size of the stone.  A small stone that is not causing symptoms may be treated with home care.  For a stone that is large enough to cause symptoms, the treatment options may include: ? Probing and widening of the duct to allow the stone to pass. ? Inserting a thin, flexible scope (endoscope) into the duct to locate and remove the stone. ? Breaking up the stone with sound waves. ? Removing the entire salivary gland. Follow these instructions at home:  To relieve discomfort  Follow these instructions every few hours: ? Suck on a lemon candy to stimulate the flow of saliva. ? Put a warm compress over the gland. ? Gently massage the gland. General instructions  Drink enough fluid to keep your urine pale yellow.  Do not use any products that contain nicotine or tobacco, such as cigarettes and e-cigarettes. If you need help quitting, ask your health care provider.  Keep all follow-up visits as told by your health care provider. This is important. Contact a health care provider if:  You have pain and swelling in your face, jaw, or mouth after eating.  You have persistent swelling in any of these places: ? In front of your ear. ? Under your jaw. ? Inside your mouth. Get  help right away if:  You have pain and swelling in your face, jaw, or mouth, and this is getting worse.  Your pain and swelling make it hard to swallow or breathe. Summary  A salivary stone is a mineral deposit that builds up in the ducts that drain your salivary glands.  When a stone forms, saliva can back up into the gland and cause painful swelling.  Salivary stones may be caused by any condition that reduces the flow of saliva.  Treatment for this condition depends on the size of the  stone. This information is not intended to replace advice given to you by your health care provider. Make sure you discuss any questions you have with your health care provider. Document Revised: 05/20/2017 Document Reviewed: 05/20/2017 Elsevier Patient Education  2020 [employer].

## 2020-08-15 ENCOUNTER — Ambulatory Visit (INDEPENDENT_AMBULATORY_CARE_PROVIDER_SITE_OTHER): Payer: No Typology Code available for payment source | Admitting: Family Medicine

## 2020-08-15 ENCOUNTER — Encounter: Payer: Self-pay | Admitting: Family Medicine

## 2020-08-15 ENCOUNTER — Other Ambulatory Visit: Payer: Self-pay

## 2020-08-15 VITALS — BP 138/84 | HR 85 | Ht 70.0 in | Wt 165.0 lb

## 2020-08-15 DIAGNOSIS — I10 Essential (primary) hypertension: Secondary | ICD-10-CM

## 2020-08-15 DIAGNOSIS — F331 Major depressive disorder, recurrent, moderate: Secondary | ICD-10-CM

## 2020-08-15 MED ORDER — SERTRALINE HCL 50 MG PO TABS: 50.0000 mg | ORAL_TABLET | Freq: Every day | ORAL | 1 refills | Status: DC

## 2020-08-15 MED ORDER — LISINOPRIL-HYDROCHLOROTHIAZIDE 10-12.5 MG PO TABS: 1.0000 | ORAL_TABLET | Freq: Every day | ORAL | 1 refills | Status: DC

## 2020-08-15 NOTE — Progress Notes (Signed)
Date:  08/15/2020   Name:  Jordan Morton   DOB:  01-08-1956   MRN:  [id number]   Chief Complaint: Depression and Hypertension  Depression        This is a chronic problem.  The current episode started more than 1 year ago.   The onset quality is gradual.   The problem has been gradually improving since onset.  Associated symptoms include fatigue.  Associated symptoms include no decreased concentration, no helplessness, no hopelessness, does not have insomnia, not irritable, no restlessness, no decreased interest, no appetite change, no body aches, no myalgias, no headaches, no indigestion, not sad and no suicidal ideas.     The symptoms are aggravated by nothing.  Past treatments include SSRIs - Selective serotonin reuptake inhibitors.  Compliance with treatment is good.  Previous treatment provided mild relief.   Pertinent negatives include no anxiety. Hypertension This is a chronic problem. The current episode started more than 1 year ago. The problem has been waxing and waning since onset. The problem is controlled. Pertinent negatives include no anxiety, blurred vision, chest pain, headaches, malaise/fatigue, neck pain, orthopnea, palpitations, peripheral edema, PND, shortness of breath or sweats. There are no associated agents to hypertension. Past treatments include ACE inhibitors and diuretics. The current treatment provides moderate improvement. There are no compliance problems.  There is no history of angina, kidney disease, CAD/MI, CVA, heart failure, left ventricular hypertrophy, PVD or retinopathy. There is no history of chronic renal disease, a hypertension causing med or renovascular disease.    Lab Results  Component Value Date   CREATININE 0.84 04/28/2019   BUN 10 04/28/2019   NA 139 04/28/2019   K 4.8 04/28/2019   CL 99 04/28/2019   CO2 26 04/28/2019   Lab Results  Component Value Date   CHOL 124 02/03/2020   HDL 51 02/03/2020   LDLCALC 90 04/28/2019   TRIG 145  02/03/2020   TRIG 45 02/03/2020   CHOLHDL 3.1 04/02/2018   No results found for: TSH No results found for: HGBA1C No results found for: WBC, HGB, HCT, MCV, PLT No results found for: ALT, AST, GGT, ALKPHOS, BILITOT   Review of Systems  Constitutional: Positive for fatigue. Negative for appetite change, chills, fever and malaise/fatigue.  HENT: Negative for drooling, ear discharge, ear pain and sore throat.   Eyes: Negative for blurred vision.  Respiratory: Negative for cough, shortness of breath and wheezing.   Cardiovascular: Negative for chest pain, palpitations, orthopnea, leg swelling and PND.  Gastrointestinal: Negative for abdominal pain, blood in stool, constipation, diarrhea and nausea.  Endocrine: Negative for polydipsia.  Genitourinary: Negative for dysuria, frequency, hematuria and urgency.  Musculoskeletal: Negative for back pain, myalgias and neck pain.  Skin: Negative for rash.  Allergic/Immunologic: Negative for environmental allergies.  Neurological: Negative for dizziness and headaches.  Hematological: Does not bruise/bleed easily.  Psychiatric/Behavioral: Positive for depression. Negative for decreased concentration and suicidal ideas. The patient is not nervous/anxious and does not have insomnia.     Patient Active Problem List   Diagnosis Date Noted  . Essential hypertension 05/22/2016  . Depression, major, single episode, mild ([place]) 05/22/2016  . Hyperlipidemia 05/22/2016  . Nocturia 05/22/2016    No Known Allergies  Past Surgical History:  Procedure Laterality Date  . COLONOSCOPY  2014   repeat in 5 years  . COLONOSCOPY WITH PROPOFOL N/A 04/08/2018   Procedure: COLONOSCOPY WITH PROPOFOL;  Surgeon: Jonathon Bellows, MD;  Location: [facility] ENDOSCOPY;  Service: Gastroenterology;  Laterality: N/A;  . TONSILLECTOMY      Social History   Tobacco Use  . Smoking status: Current Every Day Smoker    Packs/day: 1.00    Years: 45.00    Pack years: 45.00    Types:  Cigarettes  . Smokeless tobacco: Current User    Types: Snuff  Vaping Use  . Vaping Use: Never used  Substance Use Topics  . Alcohol use: Yes    Alcohol/week: 56.0 standard drinks    Types: 28 Cans of beer, 28 Standard drinks or equivalent per week  . Drug use: No     Medication list has been reviewed and updated.  Current Meds  Medication Sig  . aspirin EC 81 MG tablet Take 1 tablet (81 mg total) by mouth daily.  Marland Kitchen lisinopril-hydrochlorothiazide (ZESTORETIC) 10-12.5 MG tablet Take 1 tablet by mouth daily.  . sertraline (ZOLOFT) 50 MG tablet Take 1 tablet (50 mg total) by mouth daily.  . [DISCONTINUED] Multiple Vitamin (MULTIVITAMIN) tablet Take 1 tablet by mouth daily.    PHQ 2/9 Scores 08/15/2020 04/11/2020 02/23/2020 10/14/2019  PHQ - 2 Score 0 0 0 0  PHQ- 9 Score 1 0 0 0    GAD 7 : Generalized Anxiety Score 08/15/2020 04/11/2020 02/23/2020 10/14/2019  Nervous, Anxious, on Edge 0 0 0 0  Control/stop worrying 0 0 0 0  Worry too much - different things 0 0 0 0  Trouble relaxing 0 0 0 0  Restless 0 0 0 0  Easily annoyed or irritable 0 0 0 0  Afraid - awful might happen 0 0 0 0  Total GAD 7 Score 0 0 0 0  Anxiety Difficulty - - - Not difficult at all    BP Readings from Last 3 Encounters:  08/15/20 138/84  04/11/20 130/80  02/23/20 130/70    Physical Exam Vitals and nursing note reviewed.  Constitutional:      General: He is not irritable. HENT:     Head: Normocephalic.     Right Ear: Tympanic membrane and external ear normal.     Left Ear: Tympanic membrane and external ear normal.     Nose: Nose normal.     Mouth/Throat:     Mouth: Mucous membranes are dry.     Pharynx: Oropharynx is clear. No oropharyngeal exudate or posterior oropharyngeal erythema.  Eyes:     General: No scleral icterus.       Right eye: No discharge.        Left eye: No discharge.     Extraocular Movements: Extraocular movements intact.     Conjunctiva/sclera: Conjunctivae normal.      Pupils: Pupils are equal, round, and reactive to light.  Neck:     Thyroid: No thyromegaly.     Vascular: No JVD.     Trachea: No tracheal deviation.  Cardiovascular:     Rate and Rhythm: Normal rate and regular rhythm.     Heart sounds: Normal heart sounds. No murmur heard. No friction rub. No gallop.   Pulmonary:     Effort: No respiratory distress.     Breath sounds: Normal breath sounds. No wheezing, rhonchi or rales.  Abdominal:     General: Bowel sounds are normal.     Palpations: Abdomen is soft. There is no mass.     Tenderness: There is no abdominal tenderness. There is no right CVA tenderness, left CVA tenderness, guarding or rebound.     Hernia: No hernia is present.  Musculoskeletal:  General: No tenderness. Normal range of motion.     Cervical back: Normal range of motion and neck supple.  Lymphadenopathy:     Cervical: No cervical adenopathy.  Skin:    General: Skin is warm.     Findings: No rash.  Neurological:     Mental Status: He is alert and oriented to person, place, and time.     Cranial Nerves: No cranial nerve deficit.     Deep Tendon Reflexes: Reflexes are normal and symmetric.     Wt Readings from Last 3 Encounters:  08/15/20 165 lb (74.8 kg)  04/11/20 162 lb (73.5 kg)  02/23/20 164 lb (74.4 kg)    BP 138/84   Pulse 85   Ht 5\' 10"  (1.778 m)   Wt 165 lb (74.8 kg)   BMI 23.68 kg/m   Assessment and Plan:  1. Essential hypertension Chronic.  Controlled.  Stable.  Blood pressure is 138/84.  Continue lisinopril hydrochlorothiazide 10-12.5 mg once a day.  Will check renal function panel for current electrolytes/GFR. - lisinopril-hydrochlorothiazide (ZESTORETIC) 10-12.5 MG tablet; Take 1 tablet by mouth daily.  Dispense: 90 tablet; Refill: 1 - Renal Function Panel  2. Moderate episode of recurrent major depressive disorder ([place]) .  Controlled.  Stable.  PHQ was 1 secondary to fatigue and gad score is 0.  We will continue sertraline 50 mg  once a day.  We will recheck in 6 months. - sertraline (ZOLOFT) 50 MG tablet; Take 1 tablet (50 mg total) by mouth daily.  Dispense: 90 tablet; Refill: 1

## 2020-08-16 LAB — RENAL FUNCTION PANEL
Albumin: 4.6 g/dL (ref 3.8–4.8)
BUN/Creatinine Ratio: 10 (ref 10–24)
BUN: 8 mg/dL (ref 8–27)
CO2: 25 mmol/L (ref 20–29)
Calcium: 9.1 mg/dL (ref 8.6–10.2)
Chloride: 95 mmol/L — ABNORMAL LOW (ref 96–106)
Creatinine, Ser: 0.83 mg/dL (ref 0.76–1.27)
Glucose: 90 mg/dL (ref 65–99)
Phosphorus: 3.5 mg/dL (ref 2.8–4.1)
Potassium: 4.6 mmol/L (ref 3.5–5.2)
Sodium: 137 mmol/L (ref 134–144)
eGFR: 98 mL/min/{1.73_m2} (ref >59)

## 2020-10-18 ENCOUNTER — Encounter: Payer: Self-pay | Admitting: Family Medicine

## 2020-10-18 ENCOUNTER — Other Ambulatory Visit: Payer: Self-pay

## 2020-10-18 ENCOUNTER — Ambulatory Visit (INDEPENDENT_AMBULATORY_CARE_PROVIDER_SITE_OTHER): Payer: No Typology Code available for payment source | Admitting: Family Medicine

## 2020-10-18 VITALS — BP 124/72 | HR 80 | Ht 70.0 in | Wt 159.0 lb

## 2020-10-18 DIAGNOSIS — N529 Male erectile dysfunction, unspecified: Secondary | ICD-10-CM | POA: Diagnosis not present

## 2020-10-18 DIAGNOSIS — Z122 Encounter for screening for malignant neoplasm of respiratory organs: Secondary | ICD-10-CM | POA: Diagnosis not present

## 2020-10-18 MED ORDER — SILDENAFIL CITRATE 100 MG PO TABS: 100.0000 mg | ORAL_TABLET | Freq: Every day | ORAL | 5 refills | Status: DC | PRN

## 2020-10-18 NOTE — Progress Notes (Signed)
Date:  10/18/2020   Name:  Jordan Morton   DOB:  1955-06-03   MRN:  [id number]   Chief Complaint: Erectile Dysfunction (Can't get erection- sildenafil works)  Erectile Dysfunction This is a recurrent problem. The problem has been waxing and waning since onset. The nature of his difficulty is achieving erection. He reports no anxiety, decreased libido or performance anxiety. Irritative symptoms do not include frequency, nocturia or urgency. Obstructive symptoms do not include dribbling, incomplete emptying, an intermittent stream, a slower stream, straining or a weak stream. Pertinent negatives include no chills, dysuria or hematuria.   Lab Results  Component Value Date   CREATININE 0.83 08/15/2020   BUN 8 08/15/2020   NA 137 08/15/2020   K 4.6 08/15/2020   CL 95 (L) 08/15/2020   CO2 25 08/15/2020   Lab Results  Component Value Date   CHOL 124 02/03/2020   HDL 51 02/03/2020   LDLCALC 90 04/28/2019   TRIG 145 02/03/2020   TRIG 45 02/03/2020   CHOLHDL 3.1 04/02/2018   No results found for: TSH No results found for: HGBA1C No results found for: WBC, HGB, HCT, MCV, PLT No results found for: ALT, AST, GGT, ALKPHOS, BILITOT   Review of Systems  Constitutional:  Negative for chills and fever.  HENT:  Negative for drooling, ear discharge, ear pain and sore throat.   Respiratory:  Negative for cough, shortness of breath and wheezing.   Cardiovascular:  Negative for chest pain, palpitations and leg swelling.  Gastrointestinal:  Negative for abdominal pain, blood in stool, constipation, diarrhea and nausea.  Endocrine: Negative for polydipsia.  Genitourinary:  Negative for decreased libido, dysuria, frequency, hematuria, incomplete emptying, nocturia and urgency.  Musculoskeletal:  Negative for back pain, myalgias and neck pain.  Skin:  Negative for rash.  Allergic/Immunologic: Negative for environmental allergies.  Neurological:  Negative for dizziness and headaches.   Hematological:  Does not bruise/bleed easily.  Psychiatric/Behavioral:  Negative for suicidal ideas. The patient is not nervous/anxious.    Patient Active Problem List   Diagnosis Date Noted   Essential hypertension 05/22/2016   Depression, major, single episode, mild ([place]) 05/22/2016   Hyperlipidemia 05/22/2016   Nocturia 05/22/2016    No Known Allergies  Past Surgical History:  Procedure Laterality Date   COLONOSCOPY  2014   repeat in 5 years   COLONOSCOPY WITH PROPOFOL N/A 04/08/2018   Procedure: COLONOSCOPY WITH PROPOFOL;  Surgeon: Jonathon Bellows, MD;  Location: [facility] ENDOSCOPY;  Service: Gastroenterology;  Laterality: N/A;   TONSILLECTOMY      Social History   Tobacco Use   Smoking status: Every Day    Packs/day: 1.00    Years: 45.00    Pack years: 45.00    Types: Cigarettes   Smokeless tobacco: Current    Types: Snuff  Vaping Use   Vaping Use: Never used  Substance Use Topics   Alcohol use: Yes    Alcohol/week: 56.0 standard drinks    Types: 28 Cans of beer, 28 Standard drinks or equivalent per week   Drug use: No     Medication list has been reviewed and updated.  Current Meds  Medication Sig   aspirin EC 81 MG tablet Take 1 tablet (81 mg total) by mouth daily.   lisinopril-hydrochlorothiazide (ZESTORETIC) 10-12.5 MG tablet Take 1 tablet by mouth daily.   Multiple Vitamins-Minerals (MULTIVITAMIN ADULTS 50+ PO) Take 1 each by mouth daily.   sertraline (ZOLOFT) 50 MG tablet Take 1 tablet (50 mg  total) by mouth daily.    PHQ 2/9 Scores 10/18/2020 08/15/2020 04/11/2020 02/23/2020  PHQ - 2 Score 0 0 0 0  PHQ- 9 Score 0 1 0 0    GAD 7 : Generalized Anxiety Score 10/18/2020 08/15/2020 04/11/2020 02/23/2020  Nervous, Anxious, on Edge 0 0 0 0  Control/stop worrying 0 0 0 0  Worry too much - different things 0 0 0 0  Trouble relaxing 0 0 0 0  Restless 0 0 0 0  Easily annoyed or irritable 0 0 0 0  Afraid - awful might happen 0 0 0 0  Total GAD 7 Score 0 0 0 0   Anxiety Difficulty - - - -    BP Readings from Last 3 Encounters:  10/18/20 124/72  08/15/20 138/84  04/11/20 130/80    Physical Exam Vitals and nursing note reviewed.  HENT:     Head: Normocephalic.     Right Ear: Tympanic membrane, ear canal and external ear normal.     Left Ear: Tympanic membrane, ear canal and external ear normal. There is no impacted cerumen.     Nose: Nose normal. No congestion or rhinorrhea.  Eyes:     General: No scleral icterus.       Right eye: No discharge.        Left eye: No discharge.     Conjunctiva/sclera: Conjunctivae normal.     Pupils: Pupils are equal, round, and reactive to light.  Neck:     Thyroid: No thyromegaly.     Vascular: No JVD.     Trachea: No tracheal deviation.  Cardiovascular:     Rate and Rhythm: Normal rate and regular rhythm.     Heart sounds: Normal heart sounds. No murmur heard.   No friction rub. No gallop.  Pulmonary:     Effort: No respiratory distress.     Breath sounds: Normal breath sounds. No wheezing, rhonchi or rales.  Abdominal:     General: Bowel sounds are normal.     Palpations: Abdomen is soft. There is no mass.     Tenderness: There is no abdominal tenderness. There is no guarding or rebound.  Genitourinary:    Penis: Normal.      Testes: Normal.     Epididymis:     Right: Normal.     Left: Normal.     Prostate: Normal. Not enlarged, not tender and no nodules present.     Rectum: Normal. Guaiac result negative. No mass.  Musculoskeletal:        General: No tenderness. Normal range of motion.     Cervical back: Normal range of motion and neck supple.  Lymphadenopathy:     Cervical: No cervical adenopathy.     Lower Body: No right inguinal adenopathy. No left inguinal adenopathy.  Skin:    General: Skin is warm.     Findings: No rash.  Neurological:     Mental Status: He is alert and oriented to person, place, and time.     Cranial Nerves: No cranial nerve deficit.     Deep Tendon Reflexes:  Reflexes are normal and symmetric.    Wt Readings from Last 3 Encounters:  10/18/20 159 lb (72.1 kg)  08/15/20 165 lb (74.8 kg)  04/11/20 162 lb (73.5 kg)    BP 124/72   Pulse 80   Ht 5\' 10"  (1.778 m)   Wt 159 lb (72.1 kg)   BMI 22.81 kg/m   Assessment and Plan:  1. Erectile  dysfunction, unspecified erectile dysfunction type  new onset symptomatic for the most part of the year.  Uncomplicated.  Exam is normal.  Patient is not feeling fatigued.  Primarily would like to use for initiating erectile activity.  Will prescribe sildenafil 100 mg 1 as needed.  2. Screening for lung cancer Patient has been referred for screening purposes because of his age 65 and that he has a greater than 40-pack-year history of smoking.  Discussion with patient and he agrees to proceed for screening purposes.

## 2020-10-25 ENCOUNTER — Telehealth: Payer: Self-pay | Admitting: *Deleted

## 2020-10-25 DIAGNOSIS — Z87891 Personal history of nicotine dependence: Secondary | ICD-10-CM

## 2020-10-25 DIAGNOSIS — F17200 Nicotine dependence, unspecified, uncomplicated: Secondary | ICD-10-CM

## 2020-10-25 DIAGNOSIS — Z122 Encounter for screening for malignant neoplasm of respiratory organs: Secondary | ICD-10-CM

## 2020-10-25 NOTE — Telephone Encounter (Signed)
Received referral for initial lung cancer screening scan. Contacted patient and obtained smoking history,(current, 45 pack year) as well as answering questions related to screening process. Patient denies signs of lung cancer such as weight loss or hemoptysis. Patient denies comorbidity that would prevent curative treatment if lung cancer were found. Patient is scheduled for shared decision making visit and CT scan on 10/28/20.

## 2020-10-28 ENCOUNTER — Inpatient Hospital Stay: Payer: PRIVATE HEALTH INSURANCE | Attending: Oncology | Admitting: Hospice and Palliative Medicine

## 2020-10-28 ENCOUNTER — Ambulatory Visit
Admission: RE | Admit: 2020-10-28 | Discharge: 2020-10-28 | Disposition: A | Discharge status: Home / Self Care | Payer: PRIVATE HEALTH INSURANCE | Source: Ambulatory Visit | Attending: Oncology | Admitting: Oncology

## 2020-10-28 ENCOUNTER — Other Ambulatory Visit: Payer: Self-pay

## 2020-10-28 DIAGNOSIS — Z87891 Personal history of nicotine dependence: Secondary | ICD-10-CM

## 2020-10-28 DIAGNOSIS — F17200 Nicotine dependence, unspecified, uncomplicated: Secondary | ICD-10-CM | POA: Diagnosis present

## 2020-10-28 DIAGNOSIS — Z122 Encounter for screening for malignant neoplasm of respiratory organs: Secondary | ICD-10-CM

## 2020-10-28 NOTE — Progress Notes (Signed)
Virtual Visit via Telephone Note  I connected withNAME@ on 10/28/20 by telephone and verified that I am speaking with the correct person using two identifiers.   I discussed the limitations of evaluation and management by telemedicine and the availability of in person appointments. The patient expressed understanding and agreed to proceed.  Location: Patient: [facility] Provider: Clinic   In accordance with CMS guidelines, patient has met eligibility criteria including age, absence of signs or symptoms of lung cancer.  Social History   Tobacco Use   Smoking status: Every Day    Packs/day: 1.00    Years: 45.00    Pack years: 45.00    Types: Cigarettes   Smokeless tobacco: Current    Types: Snuff  Vaping Use   Vaping Use: Never used  Substance Use Topics   Alcohol use: Yes    Alcohol/week: 56.0 standard drinks    Types: 28 Cans of beer, 28 Standard drinks or equivalent per week   Drug use: No      A shared decision-making session was conducted prior to the performance of CT scan. This includes one or more decision aids, includes benefits and harms of screening, follow-up diagnostic testing, over-diagnosis, false positive rate, and total radiation exposure.   Counseling on the importance of adherence to annual lung cancer LDCT screening, impact of co-morbidities, and ability or willingness to undergo diagnosis and treatment is imperative for compliance of the program.   Counseling on the importance of continued smoking cessation for former smokers; the importance of smoking cessation for current smokers, and information about tobacco cessation interventions have been given to patient including [place] and [facility] quit [place] programs.   Written order for lung cancer screening with LDCT has been given to the patient and any and all questions have been answered to the best of my abilities.    Yearly follow up will be coordinated by Burgess Estelle, Thoracic Navigator.  Time Total:  5 minutes  Visit consisted of counseling and education dealing with complex health screening. Greater than 50%  of this time was spent counseling and coordinating care related to the above assessment and plan.  Signed by: Altha Harm, PhD, NP-C

## 2020-11-09 ENCOUNTER — Encounter: Payer: Self-pay | Admitting: *Deleted

## 2021-02-02 ENCOUNTER — Ambulatory Visit (INDEPENDENT_AMBULATORY_CARE_PROVIDER_SITE_OTHER): Payer: No Typology Code available for payment source | Admitting: Family Medicine

## 2021-02-02 ENCOUNTER — Other Ambulatory Visit: Payer: Self-pay

## 2021-02-02 ENCOUNTER — Encounter: Payer: Self-pay | Admitting: Family Medicine

## 2021-02-02 VITALS — BP 130/72 | HR 84 | Ht 70.0 in | Wt 163.0 lb

## 2021-02-02 DIAGNOSIS — N529 Male erectile dysfunction, unspecified: Secondary | ICD-10-CM | POA: Diagnosis not present

## 2021-02-02 DIAGNOSIS — Z23 Encounter for immunization: Secondary | ICD-10-CM

## 2021-02-02 DIAGNOSIS — I10 Essential (primary) hypertension: Secondary | ICD-10-CM

## 2021-02-02 DIAGNOSIS — F331 Major depressive disorder, recurrent, moderate: Secondary | ICD-10-CM

## 2021-02-02 MED ORDER — SILDENAFIL CITRATE 100 MG PO TABS: 100.0000 mg | ORAL_TABLET | Freq: Every day | ORAL | 5 refills | Status: DC | PRN

## 2021-02-02 MED ORDER — SERTRALINE HCL 50 MG PO TABS: 50.0000 mg | ORAL_TABLET | Freq: Every day | ORAL | 1 refills | Status: DC

## 2021-02-02 MED ORDER — LISINOPRIL-HYDROCHLOROTHIAZIDE 10-12.5 MG PO TABS: 1.0000 | ORAL_TABLET | Freq: Every day | ORAL | 1 refills | Status: DC

## 2021-02-02 NOTE — Patient Instructions (Signed)
How to Take Your Blood Pressure ?Blood pressure measures how strongly your blood is pressing against the walls of your arteries. Arteries are blood vessels that carry blood from your heart throughout your body. You can take your blood pressure at home with a machine. ?You may need to check your blood pressure at home: ?To check if you have high blood pressure (hypertension). ?To check your blood pressure over time. ?To make sure your blood pressure medicine is working. ?Supplies needed: ?Blood pressure machine, or monitor. ?Dining room chair to sit in. ?Table or desk. ?Small notebook. ?Pencil or pen. ?How to prepare ?Avoid these things for 30 minutes before checking your blood pressure: ?Having drinks with caffeine in them, such as coffee or tea. ?Drinking alcohol. ?Eating. ?Smoking. ?Exercising. ?Do these things five minutes before checking your blood pressure: ?Go to the bathroom and pee (urinate). ?Sit in a dining chair. Do not sit on a soft couch or an armchair. ?Be quiet. Do not talk. ?How to take your blood pressure ?Follow the instructions that came with your machine. If you have a digital blood pressure monitor, these may be the instructions: ?Sit up straight. ?Place your feet on the floor. Do not cross your ankles or legs. ?Rest your left arm at the level of your heart. You may rest it on a table, desk, or chair. ?Pull up your shirt sleeve. ?Wrap the blood pressure cuff around the upper part of your left arm. The cuff should be 1 inch (2.5 cm) above your elbow. It is best to wrap the cuff around bare skin. ?Fit the cuff snugly around your arm. You should be able to place only one finger between the cuff and your arm. ?Place the cord so that it rests in the bend of your elbow. ?Press the power button. ?Sit quietly while the cuff fills with air and loses air. ?Write down the numbers on the screen. ?Wait 2-3 minutes and then repeat steps 1-10. ?What do the numbers mean? ?Two numbers make up your blood  pressure. The first number is called systolic pressure. The second is called diastolic pressure. An example of a blood pressure reading is "120 over 80" (or 120/80). ?If you are an adult and do not have a medical condition, use this guide to find out if your blood pressure is normal: ?Normal ?First number: below 120. ?Second number: below 80. ?Elevated ?First number: 120-129. ?Second number: below 80. ?Hypertension stage 1 ?First number: 130-139. ?Second number: 80-89. ?Hypertension stage 2 ?First number: 140 or above. ?Second number: 90 or above. ?Your blood pressure is above normal even if only the first or only the second number is above normal. ?Follow these instructions at home: ?Medicines ?Take over-the-counter and prescription medicines only as told by your doctor. ?Tell your doctor if your medicine is causing side effects. ?General instructions ?Check your blood pressure as often as your doctor tells you to. ?Check your blood pressure at the same time every day. ?Take your monitor to your next doctor's appointment. Your doctor will: ?Make sure you are using it correctly. ?Make sure it is working right. ?Understand what your blood pressure numbers should be. ?Keep all follow-up visits as told by your doctor. This is important. ?General tips ?You will need a blood pressure machine, or monitor. Your doctor can suggest a monitor. You can buy one at a drugstore or online. When choosing one: ?Choose one with an arm cuff. ?Choose one that wraps around your upper arm. Only one finger should fit between   your arm and the cuff. ?Do not choose one that measures your blood pressure from your wrist or finger. ?Where to find more information ?American Heart Association: www.heart.org ?Contact a doctor if: ?Your blood pressure keeps being high. ?Your blood pressure is suddenly low. ?Get help right away if: ?Your first blood pressure number is higher than 180. ?Your second blood pressure number is higher than  120. ?Summary ?Check your blood pressure at the same time every day. ?Avoid caffeine, alcohol, smoking, and exercise for 30 minutes before checking your blood pressure. ?Make sure you understand what your blood pressure numbers should be. ?This information is not intended to replace advice given to you by your health care provider. Make sure you discuss any questions you have with your health care provider. ?Document Revised: 02/17/2020 Document Reviewed: 04/03/2019 ?Elsevier Patient Education ? 2022 [employer] ? ?

## 2021-02-02 NOTE — Progress Notes (Signed)
Date:  02/02/2021   Name:  Jordan Morton   DOB:  30-Oct-1955   MRN:  [id number]   Chief Complaint: Flu Vaccine, Erectile Dysfunction, Depression, and Hypertension  Erectile Dysfunction This is a chronic problem. The current episode started more than 1 year ago. The problem has been gradually improving since onset. The nature of his difficulty is achieving erection. Irritative symptoms do not include frequency, nocturia or urgency. Obstructive symptoms do not include dribbling, incomplete emptying, an intermittent stream, a slower stream, straining or a weak stream. Pertinent negatives include no chills, dysuria, genital pain, hematuria, hesitancy or inability to urinate. Nothing aggravates the symptoms. Past treatments include sildenafil. The treatment provided moderate relief.  Depression        This is a chronic problem.  The onset quality is gradual.   The problem has been gradually improving since onset.  Associated symptoms include no decreased concentration, no fatigue, no helplessness, no hopelessness, does not have insomnia, not irritable, no restlessness, no decreased interest, no appetite change, no body aches, no myalgias, no headaches, no indigestion, not sad and no suicidal ideas.  Past treatments include SSRIs - Selective serotonin reuptake inhibitors.  Compliance with treatment is good.  Previous treatment provided moderate relief.   Pertinent negatives include no anxiety. Hypertension This is a chronic problem. The current episode started more than 1 year ago. The problem has been gradually improving since onset. Pertinent negatives include no anxiety, blurred vision, chest pain, headaches, malaise/fatigue, neck pain, orthopnea, palpitations, peripheral edema, PND, shortness of breath or sweats. There are no associated agents to hypertension. Risk factors for coronary artery disease include dyslipidemia. Past treatments include ACE inhibitors and diuretics.   Lab Results   Component Value Date   CREATININE 0.83 08/15/2020   BUN 8 08/15/2020   NA 137 08/15/2020   K 4.6 08/15/2020   CL 95 (L) 08/15/2020   CO2 25 08/15/2020   Lab Results  Component Value Date   CHOL 124 02/03/2020   HDL 51 02/03/2020   LDLCALC 90 04/28/2019   TRIG 145 02/03/2020   TRIG 45 02/03/2020   CHOLHDL 3.1 04/02/2018   No results found for: TSH No results found for: HGBA1C No results found for: WBC, HGB, HCT, MCV, PLT No results found for: ALT, AST, GGT, ALKPHOS, BILITOT   Review of Systems  Constitutional:  Negative for appetite change, chills, fatigue and malaise/fatigue.  Eyes:  Negative for blurred vision.  Respiratory:  Negative for shortness of breath.   Cardiovascular:  Negative for chest pain, palpitations, orthopnea and PND.  Genitourinary:  Negative for dysuria, frequency, hematuria, hesitancy, incomplete emptying, nocturia and urgency.  Musculoskeletal:  Negative for myalgias and neck pain.  Neurological:  Negative for headaches.  Psychiatric/Behavioral:  Positive for depression. Negative for decreased concentration and suicidal ideas. The patient does not have insomnia.    Patient Active Problem List   Diagnosis Date Noted   Essential hypertension 05/22/2016   Depression, major, single episode, mild ([place]) 05/22/2016   Hyperlipidemia 05/22/2016   Nocturia 05/22/2016    No Known Allergies  Past Surgical History:  Procedure Laterality Date   COLONOSCOPY  2014   repeat in 5 years   COLONOSCOPY WITH PROPOFOL N/A 04/08/2018   Procedure: COLONOSCOPY WITH PROPOFOL;  Surgeon: Jonathon Bellows, MD;  Location[facility] ENDOSCOPY;  Service: Gastroenterology;  Laterality: N/A;   TONSILLECTOMY      Social History   Tobacco Use   Smoking status: Every Day    Packs/day:  1.00    Years: 45.00    Pack years: 45.00    Types: Cigarettes   Smokeless tobacco: Current    Types: Snuff  Vaping Use   Vaping Use: Never used  Substance Use Topics   Alcohol use: Yes     Alcohol/week: 56.0 standard drinks    Types: 28 Cans of beer, 28 Standard drinks or equivalent per week   Drug use: No     Medication list has been reviewed and updated.  Current Meds  Medication Sig   aspirin EC 81 MG tablet Take 1 tablet (81 mg total) by mouth daily.   lisinopril-hydrochlorothiazide (ZESTORETIC) 10-12.5 MG tablet Take 1 tablet by mouth daily.   Multiple Vitamins-Minerals (MULTIVITAMIN ADULTS 50+ PO) Take 1 each by mouth daily.   sertraline (ZOLOFT) 50 MG tablet Take 1 tablet (50 mg total) by mouth daily.   sildenafil (VIAGRA) 100 MG tablet Take 1 tablet (100 mg total) by mouth daily as needed for erectile dysfunction.    PHQ 2/9 Scores 02/02/2021 10/18/2020 08/15/2020 04/11/2020  PHQ - 2 Score 0 0 0 0  PHQ- 9 Score 0 0 1 0    GAD 7 : Generalized Anxiety Score 02/02/2021 10/18/2020 08/15/2020 04/11/2020  Nervous, Anxious, on Edge 0 0 0 0  Control/stop worrying 0 0 0 0  Worry too much - different things 0 0 0 0  Trouble relaxing 0 0 0 0  Restless 0 0 0 0  Easily annoyed or irritable 0 0 0 0  Afraid - awful might happen 0 0 0 0  Total GAD 7 Score 0 0 0 0  Anxiety Difficulty - - - -    BP Readings from Last 3 Encounters:  02/02/21 (!) 152/80  10/18/20 124/72  08/15/20 138/84    Physical Exam Vitals and nursing note reviewed.  Constitutional:      General: He is not irritable. HENT:     Head: Normocephalic.     Right Ear: Tympanic membrane and external ear normal.     Left Ear: Tympanic membrane and external ear normal.     Nose: Nose normal.  Eyes:     General: No scleral icterus.       Right eye: No discharge.        Left eye: No discharge.     Conjunctiva/sclera: Conjunctivae normal.     Pupils: Pupils are equal, round, and reactive to light.  Neck:     Thyroid: No thyromegaly.     Vascular: No JVD.     Trachea: No tracheal deviation.  Cardiovascular:     Rate and Rhythm: Normal rate and regular rhythm.     Heart sounds: Normal heart sounds,  S1 normal and S2 normal. No murmur heard. No systolic murmur is present.  No diastolic murmur is present.    No friction rub. No gallop. No S3 or S4 sounds.  Pulmonary:     Effort: No respiratory distress.     Breath sounds: Normal breath sounds. No wheezing or rales.  Abdominal:     General: Bowel sounds are normal.     Palpations: Abdomen is soft. There is no mass.     Tenderness: There is no abdominal tenderness. There is no guarding or rebound.  Musculoskeletal:        General: No tenderness. Normal range of motion.     Cervical back: Normal range of motion and neck supple.  Lymphadenopathy:     Cervical: No cervical adenopathy.  Skin:  General: Skin is warm.     Findings: No rash.  Neurological:     Mental Status: He is alert and oriented to person, place, and time.     Cranial Nerves: No cranial nerve deficit.    Wt Readings from Last 3 Encounters:  02/02/21 163 lb (73.9 kg)  10/28/20 159 lb (72.1 kg)  10/18/20 159 lb (72.1 kg)    BP (!) 152/80   Pulse 84   Ht 5\' 10"  (1.778 m)   Wt 163 lb (73.9 kg)   SpO2 96%   BMI 23.39 kg/m   Assessment and Plan:  1. Essential hypertension .  Controlled.  Stable.  Blood pressure is 130/76.  Continue lisinopril hydrochlorothiazide 10-12.5 mg daily.  We will check renal function panel lipid panel for current status. - lisinopril-hydrochlorothiazide (ZESTORETIC) 10-12.5 MG tablet; Take 1 tablet by mouth daily.  Dispense: 90 tablet; Refill: 1 - Renal Function Panel - Lipid Panel With LDL/HDL Ratio  2. Moderate episode of recurrent major depressive disorder (HCC) Chronic.  Controlled.  Stable.  PHQ is 0 Gad score is 0 we will continue sertraline 50 mg daily and recheck in 6 months. - sertraline (ZOLOFT) 50 MG tablet; Take 1 tablet (50 mg total) by mouth daily.  Dispense: 90 tablet; Refill: 1  3. Erectile dysfunction, unspecified erectile dysfunction type Chronic.  Controlled.  Stable.  This is controlled currently on sildenafil  100 mg 1 tablet as needed as needed and patient would like to continue with current therapeutic intervention. - sildenafil (VIAGRA) 100 MG tablet; Take 1 tablet (100 mg total) by mouth daily as needed for erectile dysfunction.  Dispense: 10 tablet; Refill: 5  4. Need for immunization against influenza Discussed and administered. - Flu Vaccine QUAD 25mo+IM (Fluarix, Fluzone & Alfiuria Quad PF)

## 2021-02-04 LAB — RENAL FUNCTION PANEL
Albumin: 4.9 g/dL — ABNORMAL HIGH (ref 3.8–4.8)
BUN/Creatinine Ratio: 10 (ref 10–24)
BUN: 9 mg/dL (ref 8–27)
CO2: 23 mmol/L (ref 20–29)
Calcium: 9.9 mg/dL (ref 8.6–10.2)
Chloride: 94 mmol/L — ABNORMAL LOW (ref 96–106)
Creatinine, Ser: 0.87 mg/dL (ref 0.76–1.27)
Glucose: 111 mg/dL — ABNORMAL HIGH (ref 70–99)
Phosphorus: 3.8 mg/dL (ref 2.8–4.1)
Potassium: 5.7 mmol/L — ABNORMAL HIGH (ref 3.5–5.2)
Sodium: 135 mmol/L (ref 134–144)
eGFR: 97 mL/min/{1.73_m2} (ref >59)

## 2021-02-04 LAB — LIPID PANEL WITH LDL/HDL RATIO
Cholesterol, Total: 164 mg/dL (ref 100–199)
HDL: 68 mg/dL (ref >39)
LDL Chol Calc (NIH): 88 mg/dL (ref 0–99)
LDL/HDL Ratio: 1.3 ratio (ref 0.0–3.6)
Triglycerides: 35 mg/dL (ref 0–149)
VLDL Cholesterol Cal: 8 mg/dL (ref 5–40)

## 2021-04-25 ENCOUNTER — Encounter: Payer: Self-pay | Admitting: Family Medicine

## 2021-04-25 ENCOUNTER — Telehealth (INDEPENDENT_AMBULATORY_CARE_PROVIDER_SITE_OTHER): Payer: No Typology Code available for payment source | Admitting: Family Medicine

## 2021-04-25 DIAGNOSIS — U071 COVID-19: Secondary | ICD-10-CM | POA: Diagnosis not present

## 2021-04-25 NOTE — Patient Instructions (Signed)

## 2021-04-25 NOTE — Progress Notes (Signed)
Date:  04/25/2021   Name:  Jordan Morton   DOB:  April 23, 1956   MRN:  [id number]   Chief Complaint: Covid Positive (Symptoms started New Years Eve with chills, body aches, fever, little cough and headache. Tested positive New Years Day- started Mulniprivar- feeling a lot better. Still a cough, no SOB)  I Army Fossa, MD from my office connected with this patient, Jordan Morton, by telephone at the patient's home.  I verified that I am speaking with the correct person using two identifiers. This visit was conducted via telephone due to the Covid-19 outbreak from my office at [facility] in [place], [place]. I discussed the limitations, risks, security and privacy concerns of performing an evaluation and management service by telephone. I also discussed with the patient that there may be a patient responsible charge related to this service. The patient expressed understanding and agreed to proceed.     Lab Results  Component Value Date   NA 135 02/03/2021   K 5.7 (H) 02/03/2021   CO2 23 02/03/2021   GLUCOSE 111 (H) 02/03/2021   BUN 9 02/03/2021   CREATININE 0.87 02/03/2021   CALCIUM 9.9 02/03/2021   EGFR 97 02/03/2021   GFRNONAA 94 04/28/2019   Lab Results  Component Value Date   CHOL 164 02/03/2021   HDL 68 02/03/2021   LDLCALC 88 02/03/2021   TRIG 35 02/03/2021   CHOLHDL 3.1 04/02/2018   No results found for: TSH No results found for: HGBA1C No results found for: WBC, HGB, HCT, MCV, PLT No results found for: ALT, AST, GGT, ALKPHOS, BILITOT No results found for: 25OHVITD2, 25OHVITD3, VD25OH   Review of Systems  Constitutional:  Positive for diaphoresis. Negative for chills, fatigue and fever.  Respiratory:  Positive for cough. Negative for shortness of breath and wheezing.   Musculoskeletal:  Positive for myalgias.  Neurological:  Positive for headaches.   Patient Active Problem List   Diagnosis Date Noted   Essential hypertension 05/22/2016   Depression,  major, single episode, mild ([place]) 05/22/2016   Hyperlipidemia 05/22/2016   Nocturia 05/22/2016    No Known Allergies  Past Surgical History:  Procedure Laterality Date   COLONOSCOPY  2014   repeat in 5 years   COLONOSCOPY WITH PROPOFOL N/A 04/08/2018   Procedure: COLONOSCOPY WITH PROPOFOL;  Surgeon: Jonathon Bellows, MD;  Location: [facility] ENDOSCOPY;  Service: Gastroenterology;  Laterality: N/A;   TONSILLECTOMY      Social History   Tobacco Use   Smoking status: Every Day    Packs/day: 1.00    Years: 45.00    Pack years: 45.00    Types: Cigarettes   Smokeless tobacco: Current    Types: Snuff  Vaping Use   Vaping Use: Never used  Substance Use Topics   Alcohol use: Yes    Alcohol/week: 56.0 standard drinks    Types: 28 Cans of beer, 28 Standard drinks or equivalent per week   Drug use: No     Medication list has been reviewed and updated.  Current Meds  Medication Sig   aspirin EC 81 MG tablet Take 1 tablet (81 mg total) by mouth daily.   lisinopril-hydrochlorothiazide (ZESTORETIC) 10-12.5 MG tablet Take 1 tablet by mouth daily.   Multiple Vitamins-Minerals (MULTIVITAMIN ADULTS 50+ PO) Take 1 each by mouth daily.   sertraline (ZOLOFT) 50 MG tablet Take 1 tablet (50 mg total) by mouth daily.   sildenafil (VIAGRA) 100 MG tablet Take 1 tablet (100 mg total) by  mouth daily as needed for erectile dysfunction.    PHQ 2/9 Scores 02/02/2021 10/18/2020 08/15/2020 04/11/2020  PHQ - 2 Score 0 0 0 0  PHQ- 9 Score 0 0 1 0    GAD 7 : Generalized Anxiety Score 02/02/2021 10/18/2020 08/15/2020 04/11/2020  Nervous, Anxious, on Edge 0 0 0 0  Control/stop worrying 0 0 0 0  Worry too much - different things 0 0 0 0  Trouble relaxing 0 0 0 0  Restless 0 0 0 0  Easily annoyed or irritable 0 0 0 0  Afraid - awful might happen 0 0 0 0  Total GAD 7 Score 0 0 0 0  Anxiety Difficulty - - - -    BP Readings from Last 3 Encounters:  02/02/21 130/72  10/18/20 124/72  08/15/20 138/84     Physical Exam Vitals reviewed.    Wt Readings from Last 3 Encounters:  02/02/21 163 lb (73.9 kg)  10/28/20 159 lb (72.1 kg)  10/18/20 159 lb (72.1 kg)    There were no vitals taken for this visit.  Assessment and Plan:  1. COVID New onset.  Gradually improving.  Patient was started on molnupiravir 800 mg twice a day and this is day 3.  Patient is gradually improving and is currently stable without any complicating symptoms.  At this time there is still some mild cough but no significant shortness of breath.  Patient will continue to take fluids and has been instructed to quarantine from additional 2 to 3 days and wear a mask for 5 days thereafter when in public.  I spent 10 minutes with this patient, More than 50% of that time was spent in face to face education, counseling and care coordination.

## 2021-05-26 ENCOUNTER — Other Ambulatory Visit: Payer: Self-pay

## 2021-05-26 DIAGNOSIS — J0110 Acute frontal sinusitis, unspecified: Secondary | ICD-10-CM

## 2021-05-26 DIAGNOSIS — R110 Nausea: Secondary | ICD-10-CM

## 2021-05-26 MED ORDER — AZITHROMYCIN 250 MG PO TABS: ORAL_TABLET | ORAL | 0 refills | Status: AC

## 2021-05-26 MED ORDER — ONDANSETRON HCL 4 MG PO TABS: 4.0000 mg | ORAL_TABLET | Freq: Three times a day (TID) | ORAL | 0 refills | Status: DC | PRN

## 2021-05-29 ENCOUNTER — Encounter: Payer: Self-pay | Admitting: Family Medicine

## 2021-05-29 ENCOUNTER — Ambulatory Visit (INDEPENDENT_AMBULATORY_CARE_PROVIDER_SITE_OTHER): Payer: 59 | Admitting: Family Medicine

## 2021-05-29 ENCOUNTER — Other Ambulatory Visit: Payer: Self-pay

## 2021-05-29 VITALS — BP 138/70 | HR 100 | Ht 70.0 in | Wt 164.0 lb

## 2021-05-29 DIAGNOSIS — R5383 Other fatigue: Secondary | ICD-10-CM | POA: Diagnosis not present

## 2021-05-29 DIAGNOSIS — E7801 Familial hypercholesterolemia: Secondary | ICD-10-CM

## 2021-05-29 DIAGNOSIS — R110 Nausea: Secondary | ICD-10-CM

## 2021-05-29 DIAGNOSIS — R351 Nocturia: Secondary | ICD-10-CM | POA: Diagnosis not present

## 2021-05-29 LAB — HEMOCCULT GUIAC POC 1CARD (OFFICE): Fecal Occult Blood, POC: NEGATIVE

## 2021-05-29 MED ORDER — DICYCLOMINE HCL 10 MG PO CAPS: 10.0000 mg | ORAL_CAPSULE | Freq: Three times a day (TID) | ORAL | 1 refills | Status: DC

## 2021-05-29 MED ORDER — PANTOPRAZOLE SODIUM 40 MG PO TBEC: 40.0000 mg | DELAYED_RELEASE_TABLET | Freq: Every day | ORAL | 3 refills | Status: DC

## 2021-05-29 NOTE — Progress Notes (Signed)
Date:  05/29/2021   Name:  Jordan Morton   DOB:  1956-04-23   MRN:  [id number]   Chief Complaint: Nausea (Nauseated x 2 weeks. Zofran helps some. On day 3 of ZPack) and Ear Pain (R) ear- hurts when blowing nose)  Thyroid Problem Presents for initial visit. Symptoms include fatigue. Patient reports no anxiety, constipation, diarrhea, hoarse voice, palpitations or weight loss.  Gastroesophageal Reflux He complains of heartburn and nausea. He reports no abdominal pain, no belching, no chest pain, no choking, no coughing, no dysphagia, no early satiety, no hoarse voice, no sore throat or no wheezing. This is a new problem. The current episode started 1 to 4 weeks ago. The problem occurs frequently. The problem has been waxing and waning. The heartburn is of moderate intensity. The heartburn does not wake him from sleep. The symptoms are aggravated by certain foods and caffeine. Associated symptoms include fatigue. Pertinent negatives include no anemia, melena, muscle weakness, orthopnea or weight loss. He has tried an antacid (pepto) for the symptoms. The treatment provided mild relief.   Lab Results  Component Value Date   NA 135 02/03/2021   K 5.7 (H) 02/03/2021   CO2 23 02/03/2021   GLUCOSE 111 (H) 02/03/2021   BUN 9 02/03/2021   CREATININE 0.87 02/03/2021   CALCIUM 9.9 02/03/2021   EGFR 97 02/03/2021   GFRNONAA 94 04/28/2019   Lab Results  Component Value Date   CHOL 164 02/03/2021   HDL 68 02/03/2021   LDLCALC 88 02/03/2021   TRIG 35 02/03/2021   CHOLHDL 3.1 04/02/2018   No results found for: TSH No results found for: HGBA1C No results found for: WBC, HGB, HCT, MCV, PLT No results found for: ALT, AST, GGT, ALKPHOS, BILITOT No results found for: 25OHVITD2, 25OHVITD3, VD25OH   Review of Systems  Constitutional:  Positive for fatigue. Negative for chills, fever and weight loss.  HENT:  Negative for drooling, ear discharge, ear pain, hoarse voice and sore throat.    Respiratory:  Negative for cough, choking, shortness of breath and wheezing.   Cardiovascular:  Negative for chest pain, palpitations and leg swelling.  Gastrointestinal:  Positive for heartburn and nausea. Negative for abdominal pain, blood in stool, constipation, diarrhea, dysphagia and melena.  Endocrine: Negative for polydipsia.  Genitourinary:  Negative for dysuria, frequency, hematuria and urgency.  Musculoskeletal:  Negative for back pain, myalgias, muscle weakness and neck pain.  Skin:  Negative for rash.  Allergic/Immunologic: Negative for environmental allergies.  Neurological:  Negative for dizziness and headaches.  Hematological:  Does not bruise/bleed easily.  Psychiatric/Behavioral:  Negative for suicidal ideas. The patient is not nervous/anxious.    Patient Active Problem List   Diagnosis Date Noted   Essential hypertension 05/22/2016   Depression, major, single episode, mild ([place]) 05/22/2016   Hyperlipidemia 05/22/2016   Nocturia 05/22/2016    No Known Allergies  Past Surgical History:  Procedure Laterality Date   COLONOSCOPY  2014   repeat in 5 years   COLONOSCOPY WITH PROPOFOL N/A 04/08/2018   Procedure: COLONOSCOPY WITH PROPOFOL;  Surgeon: Jonathon Bellows, MD;  Location: [facility] ENDOSCOPY;  Service: Gastroenterology;  Laterality: N/A;   TONSILLECTOMY      Social History   Tobacco Use   Smoking status: Every Day    Packs/day: 1.00    Years: 45.00    Pack years: 45.00    Types: Cigarettes   Smokeless tobacco: Current    Types: Snuff  Vaping Use   Vaping  Use: Never used  Substance Use Topics   Alcohol use: Yes    Alcohol/week: 56.0 standard drinks    Types: 28 Cans of beer, 28 Standard drinks or equivalent per week   Drug use: No     Medication list has been reviewed and updated.  Current Meds  Medication Sig   aspirin EC 81 MG tablet Take 1 tablet (81 mg total) by mouth daily.   azithromycin (ZITHROMAX) 250 MG tablet Take 2 tablets on day 1, then 1  tablet daily on days 2 through 5   lisinopril-hydrochlorothiazide (ZESTORETIC) 10-12.5 MG tablet Take 1 tablet by mouth daily.   Multiple Vitamins-Minerals (MULTIVITAMIN ADULTS 50+ PO) Take 1 each by mouth daily.   ondansetron (ZOFRAN) 4 MG tablet Take 1 tablet (4 mg total) by mouth every 8 (eight) hours as needed for nausea or vomiting.   sertraline (ZOLOFT) 50 MG tablet Take 1 tablet (50 mg total) by mouth daily.   sildenafil (VIAGRA) 100 MG tablet Take 1 tablet (100 mg total) by mouth daily as needed for erectile dysfunction.    PHQ 2/9 Scores 05/29/2021 02/02/2021 10/18/2020 08/15/2020  PHQ - 2 Score 0 0 0 0  PHQ- 9 Score 3 0 0 1    GAD 7 : Generalized Anxiety Score 05/29/2021 02/02/2021 10/18/2020 08/15/2020  Nervous, Anxious, on Edge 0 0 0 0  Control/stop worrying 1 0 0 0  Worry too much - different things 0 0 0 0  Trouble relaxing 0 0 0 0  Restless 0 0 0 0  Easily annoyed or irritable 0 0 0 0  Afraid - awful might happen 0 0 0 0  Total GAD 7 Score 1 0 0 0  Anxiety Difficulty Not difficult at all - - -    BP Readings from Last 3 Encounters:  05/29/21 138/70  02/02/21 130/72  10/18/20 124/72    Physical Exam Vitals and nursing note reviewed.  HENT:     Head: Normocephalic.     Right Ear: External ear normal.     Left Ear: External ear normal.     Nose: Nose normal.  Eyes:     General: No scleral icterus.       Right eye: No discharge.        Left eye: No discharge.     Conjunctiva/sclera: Conjunctivae normal.     Pupils: Pupils are equal, round, and reactive to light.  Neck:     Thyroid: No thyromegaly.     Vascular: No JVD.     Trachea: No tracheal deviation.  Cardiovascular:     Rate and Rhythm: Normal rate and regular rhythm.     Heart sounds: Normal heart sounds. No murmur heard.   No friction rub. No gallop.  Pulmonary:     Effort: No respiratory distress.     Breath sounds: Normal breath sounds. No wheezing, rhonchi or rales.  Abdominal:     General: Bowel  sounds are increased.     Palpations: Abdomen is soft. There is no hepatomegaly, splenomegaly or mass.     Tenderness: There is generalized abdominal tenderness. There is no guarding or rebound.  Genitourinary:    Prostate: Normal. Not enlarged, not tender and no nodules present.     Rectum: Normal. Guaiac result negative. No mass.  Musculoskeletal:        General: No tenderness. Normal range of motion.     Cervical back: Normal range of motion and neck supple.  Lymphadenopathy:     Cervical:  No cervical adenopathy.  Skin:    General: Skin is warm.     Findings: No rash.  Neurological:     Mental Status: He is alert and oriented to person, place, and time.     Cranial Nerves: No cranial nerve deficit.     Deep Tendon Reflexes: Reflexes are normal and symmetric.    Wt Readings from Last 3 Encounters:  05/29/21 164 lb (74.4 kg)  02/02/21 163 lb (73.9 kg)  10/28/20 159 lb (72.1 kg)    BP 138/70    Pulse 100    Ht _0  (1.778 m)    Wt 164 lb (74.4 kg)    SpO2 98%    BMI 23.53 kg/m   Assessment and Plan:  1. Nausea New onset.  Has been going on for couple of weeks with no emesis.  Patient has increased bowel sounds and generalized abdominal discomfort with mild tenderness.  We will check an H. pylori and treat with pantoprazole 40 mg once a day as well as some Bentyl. - H. pylori antibody, IgG - POCT Occult Blood Stool  2. Fatigue, unspecified type New onset.  Persistent.  Episodic in occurrence.  Will check TSH CBC and comprehensive metabolic panel for current status. - Comprehensive Metabolic Panel (CMET) - POCT Occult Blood Stool - CBC with Differential/Platelet - TSH  3. Nocturia Occasional nocturia will check PSA.  DRE was done and it was normal size shape and consistency of the prostate. - PSA  4. Familial hypercholesterolemia History of familial hyper lipidemia we will check lipid panel for current status. - Lipid Panel With LDL/HDL Ratio

## 2021-05-30 LAB — CBC WITH DIFFERENTIAL/PLATELET
Basophils Absolute: 0.1 10*3/uL (ref 0.0–0.2)
Basos: 1 %
EOS (ABSOLUTE): 0.2 10*3/uL (ref 0.0–0.4)
Eos: 2 %
Hematocrit: 47.4 % (ref 37.5–51.0)
Hemoglobin: 17.7 g/dL (ref 13.0–17.7)
Immature Grans (Abs): 0 10*3/uL (ref 0.0–0.1)
Immature Granulocytes: 1 %
Lymphocytes Absolute: 1.1 10*3/uL (ref 0.7–3.1)
Lymphs: 16 %
MCH: 34.9 pg — ABNORMAL HIGH (ref 26.6–33.0)
MCHC: 37.3 g/dL — ABNORMAL HIGH (ref 31.5–35.7)
MCV: 94 fL (ref 79–97)
Monocytes Absolute: 1.1 10*3/uL — ABNORMAL HIGH (ref 0.1–0.9)
Monocytes: 16 %
Neutrophils Absolute: 4.4 10*3/uL (ref 1.4–7.0)
Neutrophils: 64 %
Platelets: 466 10*3/uL — ABNORMAL HIGH (ref 150–450)
RBC: 5.07 x10E6/uL (ref 4.14–5.80)
RDW: 11.8 % (ref 11.6–15.4)
WBC: 6.9 10*3/uL (ref 3.4–10.8)

## 2021-05-30 LAB — COMPREHENSIVE METABOLIC PANEL
ALT: 16 IU/L (ref 0–44)
AST: 18 IU/L (ref 0–40)
Albumin/Globulin Ratio: 2.6 — ABNORMAL HIGH (ref 1.2–2.2)
Albumin: 4.9 g/dL — ABNORMAL HIGH (ref 3.8–4.8)
Alkaline Phosphatase: 51 IU/L (ref 44–121)
BUN/Creatinine Ratio: 9 — ABNORMAL LOW (ref 10–24)
BUN: 8 mg/dL (ref 8–27)
Bilirubin Total: 0.4 mg/dL (ref 0.0–1.2)
CO2: 21 mmol/L (ref 20–29)
Calcium: 10.2 mg/dL (ref 8.6–10.2)
Chloride: 97 mmol/L (ref 96–106)
Creatinine, Ser: 0.87 mg/dL (ref 0.76–1.27)
Globulin, Total: 1.9 g/dL (ref 1.5–4.5)
Glucose: 80 mg/dL (ref 70–99)
Potassium: 5.4 mmol/L — ABNORMAL HIGH (ref 3.5–5.2)
Sodium: 137 mmol/L (ref 134–144)
Total Protein: 6.8 g/dL (ref 6.0–8.5)
eGFR: 96 mL/min/{1.73_m2} (ref >59)

## 2021-05-30 LAB — H. PYLORI ANTIBODY, IGG: H. pylori, IgG AbS: 0.59 Index Value (ref 0.00–0.79)

## 2021-05-30 LAB — LIPID PANEL WITH LDL/HDL RATIO
Cholesterol, Total: 171 mg/dL (ref 100–199)
HDL: 65 mg/dL (ref >39)
LDL Chol Calc (NIH): 95 mg/dL (ref 0–99)
LDL/HDL Ratio: 1.5 ratio (ref 0.0–3.6)
Triglycerides: 56 mg/dL (ref 0–149)
VLDL Cholesterol Cal: 11 mg/dL (ref 5–40)

## 2021-05-30 LAB — PSA: Prostate Specific Ag, Serum: 1.3 ng/mL (ref 0.0–4.0)

## 2021-05-30 LAB — TSH: TSH: 1.07 u[IU]/mL (ref 0.450–4.500)

## 2021-06-21 ENCOUNTER — Other Ambulatory Visit: Payer: Self-pay | Admitting: Family Medicine

## 2021-06-21 DIAGNOSIS — R110 Nausea: Secondary | ICD-10-CM

## 2021-07-14 DIAGNOSIS — N529 Male erectile dysfunction, unspecified: Secondary | ICD-10-CM | POA: Insufficient documentation

## 2021-08-03 ENCOUNTER — Ambulatory Visit: Payer: No Typology Code available for payment source | Admitting: Family Medicine

## 2021-08-04 ENCOUNTER — Encounter: Payer: Self-pay | Admitting: Family Medicine

## 2021-08-04 ENCOUNTER — Ambulatory Visit (INDEPENDENT_AMBULATORY_CARE_PROVIDER_SITE_OTHER): Payer: 59 | Admitting: Family Medicine

## 2021-08-04 VITALS — BP 137/78 | HR 72 | Ht 70.0 in | Wt 167.0 lb

## 2021-08-04 DIAGNOSIS — F331 Major depressive disorder, recurrent, moderate: Secondary | ICD-10-CM | POA: Diagnosis not present

## 2021-08-04 DIAGNOSIS — R69 Illness, unspecified: Secondary | ICD-10-CM | POA: Diagnosis not present

## 2021-08-04 DIAGNOSIS — I10 Essential (primary) hypertension: Secondary | ICD-10-CM

## 2021-08-04 DIAGNOSIS — R110 Nausea: Secondary | ICD-10-CM | POA: Diagnosis not present

## 2021-08-04 DIAGNOSIS — K219 Gastro-esophageal reflux disease without esophagitis: Secondary | ICD-10-CM

## 2021-08-04 MED ORDER — SERTRALINE HCL 50 MG PO TABS: 50.0000 mg | ORAL_TABLET | Freq: Every day | ORAL | 5 refills | Status: DC

## 2021-08-04 MED ORDER — LISINOPRIL-HYDROCHLOROTHIAZIDE 10-12.5 MG PO TABS: 1.0000 | ORAL_TABLET | Freq: Every day | ORAL | 5 refills | Status: DC

## 2021-08-04 MED ORDER — PANTOPRAZOLE SODIUM 40 MG PO TBEC: 40.0000 mg | DELAYED_RELEASE_TABLET | Freq: Every day | ORAL | 5 refills | Status: DC

## 2021-08-04 NOTE — Progress Notes (Signed)
? ? ?Date:  08/04/2021  ? ?Name:  Jordan Morton   DOB:  1955-08-07   MRN:  [id number] ? ? ?Chief Complaint: Depression and Hypertension ? ?Depression ?       This is a chronic problem.  The current episode started more than 1 year ago.   The onset quality is sudden.   The problem occurs intermittently.  The problem has been gradually improving since onset.  Associated symptoms include no decreased concentration, no fatigue, no helplessness, no hopelessness, does not have insomnia, not irritable, no restlessness, no decreased interest, no appetite change, no body aches, no myalgias, no headaches, no indigestion, not sad and no suicidal ideas.     The symptoms are aggravated by nothing.  Past treatments include SSRIs - Selective serotonin reuptake inhibitors.  Compliance with treatment is good.  Previous treatment provided moderate relief.   Pertinent negatives include no anxiety. ?Hypertension ?This is a chronic problem. The current episode started more than 1 year ago. The problem has been waxing and waning since onset. The problem is controlled. Pertinent negatives include no anxiety, blurred vision, chest pain, headaches, malaise/fatigue, neck pain, orthopnea, palpitations, peripheral edema, PND, shortness of breath or sweats. There are no associated agents to hypertension. Risk factors for coronary artery disease include dyslipidemia. Past treatments include ACE inhibitors and diuretics. The current treatment provides moderate improvement. There are no compliance problems.  There is no history of angina, kidney disease, CAD/MI, CVA, heart failure, left ventricular hypertrophy, PVD or retinopathy. There is no history of chronic renal disease, a hypertension causing med or renovascular disease.  ?Gastroesophageal Reflux ?He reports no abdominal pain, no chest pain, no coughing, no dysphagia, no nausea, no sore throat or no wheezing. This is a chronic problem. The current episode started more than 1 year ago. The  problem has been gradually improving. Pertinent negatives include no fatigue. He has tried a PPI for the symptoms. The treatment provided moderate relief. Past procedures do not include an abdominal ultrasound, an EGD, esophageal manometry, esophageal pH monitoring, H. pylori antibody titer or a UGI.  ? ?Lab Results  ?Component Value Date  ? NA 137 05/29/2021  ? K 5.4 (H) 05/29/2021  ? CO2 21 05/29/2021  ? GLUCOSE 80 05/29/2021  ? BUN 8 05/29/2021  ? CREATININE 0.87 05/29/2021  ? CALCIUM 10.2 05/29/2021  ? EGFR 96 05/29/2021  ? GFRNONAA 94 04/28/2019  ? ?Lab Results  ?Component Value Date  ? CHOL 171 05/29/2021  ? HDL 65 05/29/2021  ? [place] 95 05/29/2021  ? TRIG 56 05/29/2021  ? CHOLHDL 3.1 04/02/2018  ? ?Lab Results  ?Component Value Date  ? TSH 1.070 05/29/2021  ? ?No results found for: HGBA1C ?Lab Results  ?Component Value Date  ? WBC 6.9 05/29/2021  ? HGB 17.7 05/29/2021  ? HCT 47.4 05/29/2021  ? MCV 94 05/29/2021  ? PLT 466 (H) 05/29/2021  ? ?Lab Results  ?Component Value Date  ? ALT 16 05/29/2021  ? AST 18 05/29/2021  ? ALKPHOS 51 05/29/2021  ? BILITOT 0.4 05/29/2021  ? ?No results found for: 25OHVITD2, [place], VD25OH  ? ?Review of Systems  ?Constitutional:  Negative for appetite change, chills, fatigue, fever and malaise/fatigue.  ?HENT:  Negative for drooling, ear discharge, ear pain and sore throat.   ?Eyes:  Negative for blurred vision.  ?Respiratory:  Negative for cough, shortness of breath and wheezing.   ?Cardiovascular:  Negative for chest pain, palpitations, orthopnea, leg swelling and PND.  ?Gastrointestinal:  Negative for abdominal pain, blood in stool, constipation, diarrhea, dysphagia and nausea.  ?Endocrine: Negative for polydipsia.  ?Genitourinary:  Negative for dysuria, frequency, hematuria and urgency.  ?Musculoskeletal:  Negative for back pain, myalgias and neck pain.  ?Skin:  Negative for rash.  ?Allergic/Immunologic: Negative for environmental allergies.  ?Neurological:  Negative for  dizziness and headaches.  ?Hematological:  Does not bruise/bleed easily.  ?Psychiatric/Behavioral:  Positive for depression. Negative for decreased concentration and suicidal ideas. The patient is not nervous/anxious and does not have insomnia.   ? ?Patient Active Problem List  ? Diagnosis Date Noted  ? Erectile dysfunction 07/14/2021  ? Essential hypertension 05/22/2016  ? Depression, major, single episode, mild ([place]) 05/22/2016  ? Hyperlipidemia 05/22/2016  ? Nocturia 05/22/2016  ? ? ?No Known Allergies ? ?Past Surgical History:  ?Procedure Laterality Date  ? COLONOSCOPY  2014  ? repeat in 5 years  ? COLONOSCOPY WITH PROPOFOL N/A 04/08/2018  ? Procedure: COLONOSCOPY WITH PROPOFOL;  Surgeon: Jonathon Bellows, MD;  Location: [facility] ENDOSCOPY;  Service: Gastroenterology;  Laterality: N/A;  ? TONSILLECTOMY    ? ? ?Social History  ? ?Tobacco Use  ? Smoking status: Every Day  ?  Packs/day: 1.00  ?  Years: 45.00  ?  Pack years: 45.00  ?  Types: Cigarettes  ? Smokeless tobacco: Current  ?  Types: Snuff  ?Vaping Use  ? Vaping Use: Never used  ?Substance Use Topics  ? Alcohol use: Yes  ?  Alcohol/week: 56.0 standard drinks  ?  Types: 28 Cans of beer, 28 Standard drinks or equivalent per week  ? Drug use: No  ? ? ? ?Medication list has been reviewed and updated. ? ?Current Meds  ?Medication Sig  ? aspirin EC 81 MG tablet Take 1 tablet (81 mg total) by mouth daily.  ? lisinopril-hydrochlorothiazide (ZESTORETIC) 10-12.5 MG tablet Take 1 tablet by mouth daily.  ? Multiple Vitamins-Minerals (MULTIVITAMIN ADULTS 50+ PO) Take 1 each by mouth daily.  ? pantoprazole (PROTONIX) 40 MG tablet Take 1 tablet (40 mg total) by mouth daily.  ? sertraline (ZOLOFT) 50 MG tablet Take 1 tablet (50 mg total) by mouth daily.  ? sildenafil (VIAGRA) 100 MG tablet Take 1 tablet (100 mg total) by mouth daily as needed for erectile dysfunction.  ? [DISCONTINUED] ondansetron (ZOFRAN) 4 MG tablet Take 1 tablet (4 mg total) by mouth every 8 (eight) hours as  needed for nausea or vomiting.  ? ? ? ?  08/04/2021  ?  8:05 AM 05/29/2021  ?  8:44 AM 02/02/2021  ?  1:43 PM 10/18/2020  ?  1:56 PM  ?GAD 7 : Generalized Anxiety Score  ?Nervous, Anxious, on Edge 0 0 0 0  ?Control/stop worrying 0 1 0 0  ?Worry too much - different things 0 0 0 0  ?Trouble relaxing 0 0 0 0  ?Restless 0 0 0 0  ?Easily annoyed or irritable 0 0 0 0  ?Afraid - awful might happen 0 0 0 0  ?Total GAD 7 Score 0 1 0 0  ?Anxiety Difficulty Not difficult at all Not difficult at all    ? ? ? ?  08/04/2021  ?  8:05 AM  ?Depression screen PHQ 2/9  ?Decreased Interest 0  ?Down, Depressed, Hopeless 0  ?PHQ - 2 Score 0  ?Altered sleeping 0  ?Tired, decreased energy 0  ?Change in appetite 0  ?Feeling bad or failure about yourself  0  ?Trouble concentrating 0  ?Moving slowly or fidgety/restless 0  ?  Suicidal thoughts 0  ?PHQ-9 Score 0  ?Difficult doing work/chores Not difficult at all  ? ? ?BP Readings from Last 3 Encounters:  ?08/04/21 (!) 158/80  ?05/29/21 138/70  ?02/02/21 130/72  ? ? ?Physical Exam ?Vitals and nursing note reviewed.  ?Constitutional:   ?   General: He is not irritable. ?HENT:  ?   Head: Normocephalic.  ?   Right Ear: External ear normal.  ?   Left Ear: External ear normal.  ?   Nose: Nose normal.  ?Eyes:  ?   General: No scleral icterus.    ?   Right eye: No discharge.     ?   Left eye: No discharge.  ?   Conjunctiva/sclera: Conjunctivae normal.  ?   Pupils: Pupils are equal, round, and reactive to light.  ?Neck:  ?   Thyroid: No thyromegaly.  ?   Vascular: No JVD.  ?   Trachea: No tracheal deviation.  ?Cardiovascular:  ?   Rate and Rhythm: Normal rate and regular rhythm.  ?   Heart sounds: Normal heart sounds. No murmur heard. ?  No friction rub. No gallop.  ?Pulmonary:  ?   Effort: No respiratory distress.  ?   Breath sounds: Normal breath sounds. No wheezing or rales.  ?Abdominal:  ?   General: Bowel sounds are normal.  ?   Palpations: Abdomen is soft. There is no mass.  ?   Tenderness: There is no  abdominal tenderness. There is no guarding or rebound.  ?Musculoskeletal:     ?   General: No tenderness. Normal range of motion.  ?   Cervical back: Normal range of motion and neck supple.  ?Lymphadenopa

## 2021-12-21 ENCOUNTER — Telehealth: Payer: Self-pay | Admitting: Acute Care

## 2021-12-21 ENCOUNTER — Other Ambulatory Visit: Payer: Self-pay

## 2021-12-21 DIAGNOSIS — N529 Male erectile dysfunction, unspecified: Secondary | ICD-10-CM

## 2021-12-21 MED ORDER — SILDENAFIL CITRATE 100 MG PO TABS: 100.0000 mg | ORAL_TABLET | Freq: Every day | ORAL | 2 refills | Status: DC | PRN

## 2021-12-21 NOTE — Telephone Encounter (Signed)
Attempted to reach pt to schedule annual LDCT-LVMM

## 2022-02-05 ENCOUNTER — Encounter: Payer: Self-pay | Admitting: Family Medicine

## 2022-02-05 ENCOUNTER — Ambulatory Visit (INDEPENDENT_AMBULATORY_CARE_PROVIDER_SITE_OTHER): Payer: 59 | Admitting: Family Medicine

## 2022-02-05 VITALS — BP 130/78 | HR 64 | Ht 70.0 in | Wt 165.0 lb

## 2022-02-05 DIAGNOSIS — F331 Major depressive disorder, recurrent, moderate: Secondary | ICD-10-CM

## 2022-02-05 DIAGNOSIS — I10 Essential (primary) hypertension: Secondary | ICD-10-CM | POA: Diagnosis not present

## 2022-02-05 DIAGNOSIS — K219 Gastro-esophageal reflux disease without esophagitis: Secondary | ICD-10-CM

## 2022-02-05 DIAGNOSIS — E7801 Familial hypercholesterolemia: Secondary | ICD-10-CM

## 2022-02-05 DIAGNOSIS — R110 Nausea: Secondary | ICD-10-CM | POA: Diagnosis not present

## 2022-02-05 DIAGNOSIS — N529 Male erectile dysfunction, unspecified: Secondary | ICD-10-CM

## 2022-02-05 DIAGNOSIS — R7989 Other specified abnormal findings of blood chemistry: Secondary | ICD-10-CM | POA: Diagnosis not present

## 2022-02-05 DIAGNOSIS — R69 Illness, unspecified: Secondary | ICD-10-CM | POA: Diagnosis not present

## 2022-02-05 DIAGNOSIS — Z23 Encounter for immunization: Secondary | ICD-10-CM

## 2022-02-05 MED ORDER — LISINOPRIL-HYDROCHLOROTHIAZIDE 10-12.5 MG PO TABS: 1.0000 | ORAL_TABLET | Freq: Every day | ORAL | 5 refills | Status: DC

## 2022-02-05 MED ORDER — SERTRALINE HCL 50 MG PO TABS: 50.0000 mg | ORAL_TABLET | Freq: Every day | ORAL | 5 refills | Status: DC

## 2022-02-05 MED ORDER — PANTOPRAZOLE SODIUM 40 MG PO TBEC: 40.0000 mg | DELAYED_RELEASE_TABLET | Freq: Every day | ORAL | 5 refills | Status: DC

## 2022-02-05 MED ORDER — SILDENAFIL CITRATE 100 MG PO TABS: 100.0000 mg | ORAL_TABLET | Freq: Every day | ORAL | 2 refills | Status: DC | PRN

## 2022-02-05 NOTE — Progress Notes (Signed)
Date:  02/05/2022   Name:  Jordan Morton   DOB:  [birth year]   MRN:  [id number]   Chief Complaint: Depression, Gastroesophageal Reflux, Hypertension, Erectile Dysfunction, and Flu Vaccine  Depression        This is a chronic problem.  The current episode started more than 1 year ago.   The onset quality is sudden.   The problem occurs intermittently.  The problem has been gradually improving since onset.  Associated symptoms include no decreased concentration, no fatigue, no helplessness, no hopelessness, does not have insomnia, not irritable, no restlessness, no decreased interest, no appetite change, no body aches, no myalgias, no headaches, no indigestion, not sad and no suicidal ideas.     The symptoms are aggravated by nothing.  Past treatments include SSRIs - Selective serotonin reuptake inhibitors.  Compliance with treatment is good.  Previous treatment provided no relief relief. Gastroesophageal Reflux He reports no abdominal pain, no belching, no chest pain, no coughing, no dysphagia, no heartburn, no nausea, no sore throat or no wheezing. This is a chronic problem. The problem has been gradually improving. The symptoms are aggravated by certain foods. Pertinent negatives include no fatigue or melena. He has tried a PPI for the symptoms. The treatment provided moderate relief.  Hypertension This is a chronic problem. The current episode started more than 1 year ago. The problem has been gradually improving since onset. Pertinent negatives include no blurred vision, chest pain, headaches, malaise/fatigue, neck pain, orthopnea, palpitations, peripheral edema, PND or shortness of breath. Risk factors for coronary artery disease include smoking/tobacco exposure. Past treatments include ACE inhibitors and diuretics. The current treatment provides moderate improvement. There are no compliance problems.  There is no history of angina, kidney disease, CAD/MI, CVA, heart failure, left ventricular  hypertrophy, PVD or retinopathy. There is no history of chronic renal disease, a hypertension causing med or renovascular disease.  Erectile Dysfunction This is a chronic problem. The current episode started more than 1 year ago. The problem has been gradually improving since onset. The nature of his difficulty is achieving erection. He reports no anxiety. Irritative symptoms do not include frequency, nocturia or urgency. Pertinent negatives include no chills, dysuria or hematuria. Past treatments include sildenafil. The treatment provided moderate relief.    Lab Results  Component Value Date   NA 137 05/29/2021   K 5.4 (H) 05/29/2021   CO2 21 05/29/2021   GLUCOSE 80 05/29/2021   BUN 8 05/29/2021   CREATININE 0.87 05/29/2021   CALCIUM 10.2 05/29/2021   EGFR 96 05/29/2021   GFRNONAA 94 04/28/2019   Lab Results  Component Value Date   CHOL 171 05/29/2021   HDL 65 05/29/2021   LDLCALC 95 05/29/2021   TRIG 56 05/29/2021   CHOLHDL 3.1 04/02/2018   Lab Results  Component Value Date   TSH 1.070 05/29/2021   No results found for: "HGBA1C" Lab Results  Component Value Date   WBC 6.9 05/29/2021   HGB 17.7 05/29/2021   HCT 47.4 05/29/2021   MCV 94 05/29/2021   PLT 466 (H) 05/29/2021   Lab Results  Component Value Date   ALT 16 05/29/2021   AST 18 05/29/2021   ALKPHOS 51 05/29/2021   BILITOT 0.4 05/29/2021   No results found for: "25OHVITD2", "25OHVITD3", "VD25OH"   Review of Systems  Constitutional:  Negative for appetite change, chills, fatigue, fever and malaise/fatigue.  HENT:  Negative for drooling, ear discharge, ear pain and sore throat.   Eyes:  Negative for blurred vision.  Respiratory:  Negative for cough, shortness of breath and wheezing.   Cardiovascular:  Negative for chest pain, palpitations, orthopnea, leg swelling and PND.  Gastrointestinal:  Negative for abdominal pain, blood in stool, constipation, diarrhea, dysphagia, heartburn, melena and nausea.   Endocrine: Negative for polydipsia.  Genitourinary:  Negative for dysuria, frequency, hematuria, nocturia and urgency.  Musculoskeletal:  Negative for back pain, myalgias and neck pain.  Skin:  Negative for rash.  Allergic/Immunologic: Negative for environmental allergies.  Neurological:  Negative for dizziness and headaches.  Hematological:  Does not bruise/bleed easily.  Psychiatric/Behavioral:  Positive for depression. Negative for decreased concentration and suicidal ideas. The patient is not nervous/anxious and does not have insomnia.     Patient Active Problem List   Diagnosis Date Noted   Erectile dysfunction 07/14/2021   Essential hypertension 05/22/2016   Depression, major, single episode, mild ([place]) 05/22/2016   Hyperlipidemia 05/22/2016   Nocturia 05/22/2016    No Known Allergies  Past Surgical History:  Procedure Laterality Date   COLONOSCOPY  2014   repeat in 5 years   COLONOSCOPY WITH PROPOFOL N/A 04/08/2018   Procedure: COLONOSCOPY WITH PROPOFOL;  Surgeon: Jonathon Bellows, MD;  Location[facility] END[facility];  Service: Gastroenterology;  Laterality: N/A;   TONSILLECTOMY      Social History   Tobacco Use   Smoking status: Every Day    Packs/day: 1.00    Years: 45.00    Total pack years: 45.00    Types: Cigarettes   Smokeless tobacco: Current    Types: Snuff  Vaping Use   Vaping Use: Never used  Substance Use Topics   Alcohol use: Yes    Alcohol/week: 56.0 standard drinks of alcohol    Types: 28 Cans of beer, 28 Standard drinks or equivalent per week   Drug use: No     Medication list has been reviewed and updated.  Current Meds  Medication Sig   aspirin EC 81 MG tablet Take 1 tablet (81 mg total) by mouth daily.   lisinopril-hydrochlorothiazide (ZESTORETIC) 10-12.5 MG tablet Take 1 tablet by mouth daily.   Multiple Vitamins-Minerals (MULTIVITAMIN ADULTS 50+ PO) Take 1 each by mouth daily.   pantoprazole (PROTONIX) 40 MG tablet Take 1 tablet (40 mg total)  by mouth daily.   sertraline (ZOLOFT) 50 MG tablet Take 1 tablet (50 mg total) by mouth daily.   sildenafil (VIAGRA) 100 MG tablet Take 1 tablet (100 mg total) by mouth daily as needed for erectile dysfunction.       02/05/2022    8:07 AM 08/04/2021    8:05 AM 05/29/2021    8:44 AM 02/02/2021    1:43 PM  GAD 7 : Generalized Anxiety Score  Nervous, Anxious, on Edge 0 0 0 0  Control/stop worrying 0 0 1 0  Worry too much - different things 0 0 0 0  Trouble relaxing 0 0 0 0  Restless 0 0 0 0  Easily annoyed or irritable 0 0 0 0  Afraid - awful might happen 0 0 0 0  Total GAD 7 Score 0 0 1 0  Anxiety Difficulty Not difficult at all Not difficult at all Not difficult at all        02/05/2022    8:07 AM 08/04/2021    8:05 AM 05/29/2021    8:43 AM  Depression screen PHQ 2/9  Decreased Interest 0 0 0  Down, Depressed, Hopeless 0 0 0  PHQ - 2 Score 0 0  0  Altered sleeping 0 0 0  Tired, decreased energy 0 0 3  Change in appetite 0 0 0  Feeling bad or failure about yourself  0 0 0  Trouble concentrating 0 0 0  Moving slowly or fidgety/restless 0 0 0  Suicidal thoughts 0 0 0  PHQ-9 Score 0 0 3  Difficult doing work/chores Not difficult at all Not difficult at all Not difficult at all    BP Readings from Last 3 Encounters:  02/05/22 130/78  08/04/21 137/78  05/29/21 138/70    Physical Exam Vitals and nursing note reviewed.  Constitutional:      General: He is not irritable. HENT:     Head: Normocephalic.     Right Ear: Tympanic membrane, ear canal and external ear normal.     Left Ear: Tympanic membrane, ear canal and external ear normal.     Nose: Nose normal. No congestion.     Mouth/Throat:     Mouth: Mucous membranes are moist.  Eyes:     General: No scleral icterus.       Right eye: No discharge.        Left eye: No discharge.     Conjunctiva/sclera: Conjunctivae normal.     Pupils: Pupils are equal, round, and reactive to light.  Neck:     Thyroid: No thyromegaly.      Vascular: No JVD.     Trachea: No tracheal deviation.  Cardiovascular:     Rate and Rhythm: Normal rate and regular rhythm.     Heart sounds: Normal heart sounds. No murmur heard.    No friction rub. No gallop.  Pulmonary:     Effort: No respiratory distress.     Breath sounds: Normal breath sounds. No wheezing, rhonchi or rales.  Abdominal:     General: Bowel sounds are normal.     Palpations: Abdomen is soft. There is no mass.     Tenderness: There is no abdominal tenderness. There is no guarding or rebound.  Musculoskeletal:        General: No tenderness. Normal range of motion.     Cervical back: Normal range of motion and neck supple.  Lymphadenopathy:     Cervical: No cervical adenopathy.  Skin:    General: Skin is warm.     Findings: No rash.  Neurological:     Mental Status: He is alert.     Wt Readings from Last 3 Encounters:  02/05/22 165 lb (74.8 kg)  08/04/21 167 lb (75.8 kg)  05/29/21 164 lb (74.4 kg)    BP 130/78   Pulse 64   Ht 5' 10"  (1.778 m)   Wt 165 lb (74.8 kg)   BMI 23.68 kg/m   Assessment and Plan:  1. Essential hypertension Chronic.  Controlled.  Stable.  Blood pressure 130/78.  Asymptomatic.  Tolerating medications well.  Continue lisinopril hydrochlorothiazide 10-12.5 mg.  Recheck renal function panel for GFR and electrolytes.  We will recheck patient in 6 months. - lisinopril-hydrochlorothiazide (ZESTORETIC) 10-12.5 MG tablet; Take 1 tablet by mouth daily.  Dispense: 30 tablet; Refill: 5 - Renal Function Panel  2. Nausea Nausea secondary to reflux is controlled on pantoprazole 40 mg once a day. - pantoprazole (PROTONIX) 40 MG tablet; Take 1 tablet (40 mg total) by mouth daily.  Dispense: 30 tablet; Refill: 5  3. Gastroesophageal reflux disease, unspecified whether esophagitis present Chronic.  Controlled.  Stable.  Continue pantoprazole 40 mg once a day. - pantoprazole (PROTONIX) 40 MG  tablet; Take 1 tablet (40 mg total) by mouth  daily.  Dispense: 30 tablet; Refill: 5  4. Moderate episode of recurrent major depressive disorder (HCC) Chronic.  Controlled.  Stable.  PHQ is 0 GAD score is 0 continue sertraline 50 mg once a day. - sertraline (ZOLOFT) 50 MG tablet; Take 1 tablet (50 mg total) by mouth daily.  Dispense: 30 tablet; Refill: 5  5. Erectile dysfunction, unspecified erectile dysfunction type Chronic.  Effective.  Stable.  Patient would like to continue current sildenafil 100 mg 1 tablet as needed. - sildenafil (VIAGRA) 100 MG tablet; Take 1 tablet (100 mg total) by mouth daily as needed for erectile dysfunction.  Dispense: 10 tablet; Refill: 2  6. Elevated platelet count Slight elevation of platelet count was noted last evaluation.  Given patient's history of smoking and increase in hypercoagulability we will recheck CBC to follow-up. - CBC w/Diff/Platelet  7. Familial hypercholesterolemia Chronic.  Diet controlled.  Stable.  We will check lipid panel for current level of control. - Lipid Panel With LDL/HDL Ratio  8. Need for immunization against influenza Discussed and administered. - Flu Vaccine QUAD 39moIM (Fluarix, Fluzone & Alfiuria Quad PF)    DOtilio Miu MD

## 2022-02-06 LAB — RENAL FUNCTION PANEL
Albumin: 4.8 g/dL (ref 3.9–4.9)
BUN/Creatinine Ratio: 7 — ABNORMAL LOW (ref 10–24)
BUN: 6 mg/dL — ABNORMAL LOW (ref 8–27)
CO2: 25 mmol/L (ref 20–29)
Calcium: 9.7 mg/dL (ref 8.6–10.2)
Chloride: 97 mmol/L (ref 96–106)
Creatinine, Ser: 0.85 mg/dL (ref 0.76–1.27)
Glucose: 91 mg/dL (ref 70–99)
Phosphorus: 3.1 mg/dL (ref 2.8–4.1)
Potassium: 4.4 mmol/L (ref 3.5–5.2)
Sodium: 136 mmol/L (ref 134–144)
eGFR: 97 mL/min/{1.73_m2} (ref >59)

## 2022-02-06 LAB — CBC WITH DIFFERENTIAL/PLATELET
Basophils Absolute: 0.1 10*3/uL (ref 0.0–0.2)
Basos: 1 %
EOS (ABSOLUTE): 0.2 10*3/uL (ref 0.0–0.4)
Eos: 2 %
Hematocrit: 45.5 % (ref 37.5–51.0)
Hemoglobin: 16.1 g/dL (ref 13.0–17.7)
Immature Grans (Abs): 0.1 10*3/uL (ref 0.0–0.1)
Immature Granulocytes: 1 %
Lymphocytes Absolute: 1.3 10*3/uL (ref 0.7–3.1)
Lymphs: 13 %
MCH: 33.8 pg — ABNORMAL HIGH (ref 26.6–33.0)
MCHC: 35.4 g/dL (ref 31.5–35.7)
MCV: 95 fL (ref 79–97)
Monocytes Absolute: 1.4 10*3/uL — ABNORMAL HIGH (ref 0.1–0.9)
Monocytes: 14 %
Neutrophils Absolute: 6.8 10*3/uL (ref 1.4–7.0)
Neutrophils: 69 %
Platelets: 436 10*3/uL (ref 150–450)
RBC: 4.77 x10E6/uL (ref 4.14–5.80)
RDW: 11.5 % — ABNORMAL LOW (ref 11.6–15.4)
WBC: 9.8 10*3/uL (ref 3.4–10.8)

## 2022-02-06 LAB — LIPID PANEL WITH LDL/HDL RATIO
Cholesterol, Total: 159 mg/dL (ref 100–199)
HDL: 66 mg/dL (ref >39)
LDL Chol Calc (NIH): 81 mg/dL (ref 0–99)
LDL/HDL Ratio: 1.2 ratio (ref 0.0–3.6)
Triglycerides: 58 mg/dL (ref 0–149)
VLDL Cholesterol Cal: 12 mg/dL (ref 5–40)

## 2022-04-24 ENCOUNTER — Encounter: Payer: Self-pay | Admitting: Family Medicine

## 2022-04-24 ENCOUNTER — Ambulatory Visit (INDEPENDENT_AMBULATORY_CARE_PROVIDER_SITE_OTHER): Payer: 59 | Admitting: Family Medicine

## 2022-04-24 ENCOUNTER — Other Ambulatory Visit: Payer: Self-pay

## 2022-04-24 VITALS — BP 120/64 | HR 88 | Ht 70.0 in | Wt 162.0 lb

## 2022-04-24 DIAGNOSIS — M5030 Other cervical disc degeneration, unspecified cervical region: Secondary | ICD-10-CM | POA: Diagnosis not present

## 2022-04-24 DIAGNOSIS — G4739 Other sleep apnea: Secondary | ICD-10-CM

## 2022-04-24 DIAGNOSIS — M25512 Pain in left shoulder: Secondary | ICD-10-CM

## 2022-04-24 NOTE — Progress Notes (Signed)
Date:  04/24/2022   Name:  Jordan Morton   DOB:  18-Jan-1956   MRN:  [id number]   Chief Complaint: Shoulder Pain (L) shoulder pain- can't sleep on L) side because arm into hand falls asleep. If turns wrong shoulder pops and sends a pain down arm. )  Shoulder Pain  The pain is present in the left shoulder and neck. This is a new problem. The current episode started more than 1 month ago (6 months). The problem has been gradually worsening. The quality of the pain is described as aching (pop with increase sharp pain to hand). The pain is at a severity of 3/10 (baseline). The pain is mild. Associated symptoms include a limited range of motion, numbness and tingling. Pertinent negatives include no fever. Associated symptoms comments: Popping sensation. The symptoms are aggravated by activity. He has tried acetaminophen for the symptoms. The treatment provided mild relief.    Lab Results  Component Value Date   NA 136 02/05/2022   K 4.4 02/05/2022   CO2 25 02/05/2022   GLUCOSE 91 02/05/2022   BUN 6 (L) 02/05/2022   CREATININE 0.85 02/05/2022   CALCIUM 9.7 02/05/2022   EGFR 97 02/05/2022   GFRNONAA 94 04/28/2019   Lab Results  Component Value Date   CHOL 159 02/05/2022   HDL 66 02/05/2022   LDLCALC 81 02/05/2022   TRIG 58 02/05/2022   CHOLHDL 3.1 04/02/2018   Lab Results  Component Value Date   TSH 1.070 05/29/2021   No results found for: "HGBA1C" Lab Results  Component Value Date   WBC 9.8 02/05/2022   HGB 16.1 02/05/2022   HCT 45.5 02/05/2022   MCV 95 02/05/2022   PLT 436 02/05/2022   Lab Results  Component Value Date   ALT 16 05/29/2021   AST 18 05/29/2021   ALKPHOS 51 05/29/2021   BILITOT 0.4 05/29/2021   No results found for: "25OHVITD2", "25OHVITD3", "VD25OH"   Review of Systems  Constitutional:  Negative for chills and fever.  HENT:  Negative for sore throat and trouble swallowing.   Respiratory:  Negative for cough, shortness of breath and wheezing.    Cardiovascular:  Negative for chest pain, palpitations and leg swelling.  Gastrointestinal:  Negative for abdominal pain, blood in stool and nausea.  Endocrine: Negative for polydipsia and polyuria.  Genitourinary:  Negative for dysuria, frequency, hematuria and urgency.  Musculoskeletal:  Positive for arthralgias. Negative for back pain, myalgias and neck pain.  Skin:  Negative for rash.  Allergic/Immunologic: Negative for environmental allergies.  Neurological:  Positive for tingling and numbness. Negative for dizziness and headaches.  Hematological:  Does not bruise/bleed easily.  Psychiatric/Behavioral:  Negative for suicidal ideas. The patient is not nervous/anxious.     Patient Active Problem List   Diagnosis Date Noted   Erectile dysfunction 07/14/2021   Essential hypertension 05/22/2016   Depression, major, single episode, mild ([place]) 05/22/2016   Hyperlipidemia 05/22/2016   Nocturia 05/22/2016    No Known Allergies  Past Surgical History:  Procedure Laterality Date   COLONOSCOPY  2014   repeat in 5 years   COLONOSCOPY WITH PROPOFOL N/A 04/08/2018   Procedure: COLONOSCOPY WITH PROPOFOL;  Surgeon: Jonathon Bellows, MD;  Location: [facility] ENDOSCOPY;  Service: Gastroenterology;  Laterality: N/A;   TONSILLECTOMY      Social History   Tobacco Use   Smoking status: Every Day    Packs/day: 1.00    Years: 45.00    Total pack years: 45.00  Types: Cigarettes   Smokeless tobacco: Current    Types: Snuff  Vaping Use   Vaping Use: Never used  Substance Use Topics   Alcohol use: Yes    Alcohol/week: 56.0 standard drinks of alcohol    Types: 28 Cans of beer, 28 Standard drinks or equivalent per week   Drug use: No     Medication list has been reviewed and updated.  Current Meds  Medication Sig   aspirin EC 81 MG tablet Take 1 tablet (81 mg total) by mouth daily.   lisinopril-hydrochlorothiazide (ZESTORETIC) 10-12.5 MG tablet Take 1 tablet by mouth daily.   Multiple  Vitamins-Minerals (MULTIVITAMIN ADULTS 50+ PO) Take 1 each by mouth daily.   pantoprazole (PROTONIX) 40 MG tablet Take 1 tablet (40 mg total) by mouth daily.   sertraline (ZOLOFT) 50 MG tablet Take 1 tablet (50 mg total) by mouth daily.   sildenafil (VIAGRA) 100 MG tablet Take 1 tablet (100 mg total) by mouth daily as needed for erectile dysfunction.       04/24/2022    4:35 PM 02/05/2022    8:07 AM 08/04/2021    8:05 AM 05/29/2021    8:44 AM  GAD 7 : Generalized Anxiety Score  Nervous, Anxious, on Edge 0 0 0 0  Control/stop worrying 0 0 0 1  Worry too much - different things 0 0 0 0  Trouble relaxing 0 0 0 0  Restless 0 0 0 0  Easily annoyed or irritable 0 0 0 0  Afraid - awful might happen 0 0 0 0  Total GAD 7 Score 0 0 0 1  Anxiety Difficulty Not difficult at all Not difficult at all Not difficult at all Not difficult at all       04/24/2022    4:35 PM 02/05/2022    8:07 AM 08/04/2021    8:05 AM  Depression screen PHQ 2/9  Decreased Interest 0 0 0  Down, Depressed, Hopeless 0 0 0  PHQ - 2 Score 0 0 0  Altered sleeping 0 0 0  Tired, decreased energy 0 0 0  Change in appetite 0 0 0  Feeling bad or failure about yourself  0 0 0  Trouble concentrating 0 0 0  Moving slowly or fidgety/restless 0 0 0  Suicidal thoughts 0 0 0  PHQ-9 Score 0 0 0  Difficult doing work/chores Not difficult at all Not difficult at all Not difficult at all    BP Readings from Last 3 Encounters:  04/24/22 120/64  02/05/22 130/78  08/04/21 137/78    Physical Exam Vitals and nursing note reviewed.  HENT:     Head: Normocephalic.     Right Ear: Tympanic membrane and external ear normal.     Left Ear: Tympanic membrane and external ear normal.     Nose: Nose normal. No congestion or rhinorrhea.  Eyes:     General: No scleral icterus.       Right eye: No discharge.        Left eye: No discharge.     Conjunctiva/sclera: Conjunctivae normal.     Pupils: Pupils are equal, round, and reactive to  light.  Neck:     Thyroid: No thyromegaly.     Vascular: No JVD.     Trachea: No tracheal deviation.  Cardiovascular:     Rate and Rhythm: Normal rate and regular rhythm.     Heart sounds: Normal heart sounds. No murmur heard.    No friction rub. No  gallop.  Pulmonary:     Effort: No respiratory distress.     Breath sounds: Normal breath sounds. No wheezing, rhonchi or rales.  Abdominal:     General: Bowel sounds are normal.     Palpations: Abdomen is soft. There is no mass.     Tenderness: There is no abdominal tenderness. There is no guarding or rebound.  Musculoskeletal:        General: No tenderness.     Left shoulder: No tenderness or bony tenderness. Decreased range of motion. Normal strength. Normal pulse.     Cervical back: Normal range of motion and neck supple. No deformity, rigidity, tenderness or bony tenderness. Pain with movement present. Normal range of motion.     Comments: Pain with abduction  Lymphadenopathy:     Cervical: No cervical adenopathy.  Skin:    General: Skin is warm.     Findings: No rash.  Neurological:     Mental Status: He is alert and oriented to person, place, and time.     Cranial Nerves: No cranial nerve deficit.     Deep Tendon Reflexes: Reflexes are normal and symmetric.     Wt Readings from Last 3 Encounters:  04/24/22 162 lb (73.5 kg)  02/05/22 165 lb (74.8 kg)  08/04/21 167 lb (75.8 kg)    BP 120/64   Pulse 88   Ht _0  (1.778 m)   Wt 162 lb (73.5 kg)   SpO2 96%   BMI 23.24 kg/m   Assessment and Plan:  1. Arthralgia of left shoulder region Chronic pain for at least a year with limited range of motion particularly abduction flexion and extension.  No palpable tenderness.  Neurologic exam is unremarkable.  There is a popping sensation with pain in certain directions.  We will refer to sports medicine but obtain x-ray first patient's also been instructed to pick up some Aleve and start on this prior to evaluation. - DG  Shoulder Left  2. Degenerative disc disease, cervical Patient also has difficulty in positioning his neck when he sleeps he has pain that will radiate down to his hand but does not realize that there is any particular nerve root involvement.  There is popping when he moves his neck in a twisting action.  I suspect there is some degenerative changes and we will refer to sports medicine for evaluation with regard to her shoulder pain being referred. - DG Cervical Spine Complete  3. Sleep apnea-like behavior Patient also has sleep apnea-like behavior as questions from [place] are not as likely to be as patient does not seem to get a restful sleep anymore.  He also relates that he has witnessed apneic episodes and very loud snoring which is suggesting of obstructive sleep apnea.  We will refer to ear nose and throat for evaluation.   Otilio Miu, MD

## 2022-04-25 ENCOUNTER — Ambulatory Visit
Admission: RE | Admit: 2022-04-25 | Discharge: 2022-04-25 | Disposition: A | Discharge status: Home / Self Care | Payer: Medicare HMO | Attending: Family Medicine | Admitting: Family Medicine

## 2022-04-25 ENCOUNTER — Ambulatory Visit
Admission: RE | Admit: 2022-04-25 | Discharge: 2022-04-25 | Disposition: A | Discharge status: Home / Self Care | Payer: Medicare HMO | Source: Ambulatory Visit | Attending: Family Medicine | Admitting: Family Medicine

## 2022-04-25 DIAGNOSIS — M19012 Primary osteoarthritis, left shoulder: Secondary | ICD-10-CM | POA: Diagnosis not present

## 2022-04-25 DIAGNOSIS — M50323 Other cervical disc degeneration at C6-C7 level: Secondary | ICD-10-CM | POA: Diagnosis not present

## 2022-04-25 DIAGNOSIS — M5030 Other cervical disc degeneration, unspecified cervical region: Secondary | ICD-10-CM | POA: Diagnosis not present

## 2022-04-25 DIAGNOSIS — M25512 Pain in left shoulder: Secondary | ICD-10-CM | POA: Diagnosis not present

## 2022-04-25 DIAGNOSIS — M50322 Other cervical disc degeneration at C5-C6 level: Secondary | ICD-10-CM | POA: Diagnosis not present

## 2022-05-07 ENCOUNTER — Encounter: Payer: Self-pay | Admitting: Family Medicine

## 2022-05-07 ENCOUNTER — Ambulatory Visit (INDEPENDENT_AMBULATORY_CARE_PROVIDER_SITE_OTHER): Payer: Medicare HMO | Admitting: Family Medicine

## 2022-05-07 VITALS — BP 142/88 | HR 97 | Ht 70.0 in | Wt 165.0 lb

## 2022-05-07 DIAGNOSIS — M25819 Other specified joint disorders, unspecified shoulder: Secondary | ICD-10-CM | POA: Insufficient documentation

## 2022-05-07 MED ORDER — CYCLOBENZAPRINE HCL 10 MG PO TABS: 10.0000 mg | ORAL_TABLET | Freq: Every evening | ORAL | 0 refills | Status: AC | PRN

## 2022-05-07 MED ORDER — MELOXICAM 15 MG PO TABS: 15.0000 mg | ORAL_TABLET | Freq: Every day | ORAL | 0 refills | Status: DC

## 2022-05-07 NOTE — Assessment & Plan Note (Signed)
Right-hand-dominant patient presenting with left shoulder pain ongoing for 6-8 months, atraumatic in onset.  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  Examination with preserved rotator cuff strength, pain during empty can, positive impingement, tenderness at the bicipital groove, positive speeds, equivocal Yergason's, negative Spurling's.  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  Plan for scheduled meloxicam,.  Cyclobenzaprine, home-based rehab, close follow-up for reevaluation.  If suboptimal progress despite here is to plan, formal PT versus advanced imaging to be considered.

## 2022-05-07 NOTE — Patient Instructions (Signed)
-  Take meloxicam daily with food x 2 weeks then daily as-needed - Can use cyclobenzaprine nightly as-needed for muscle pain - Start home exercises after 1 week and continue until return - Can contact us at/beyond 2 weeks if shoulder symptoms prohibit you from doing home exercises - Return in 6 weeks

## 2022-05-07 NOTE — Progress Notes (Signed)
     Primary Care / Sports Medicine Office Visit  Patient Information:  Patient ID: Jordan Morton, male DOB: 27-Nov-1955 Age: 67 y.o. MRN: [id number]   Jordan Morton is a pleasant 67 y.o. male presenting with the following:  Chief Complaint  Patient presents with   Shoulder Pain    Shoulder Pain left shoulder pain- can't sleep on Left side because arm into hand falls asleep. If turns wrong shoulder pops and sends a pain down arm.     Vitals:   05/07/22 1512  BP: (!) 142/88  Pulse: 97  SpO2: 98%   Vitals:   05/07/22 1512  Weight: 165 lb (74.8 kg)  Height: '5\' 10"'$  (1.778 m)   Body mass index is 23.68 kg/m.     Independent interpretation of notes and tests performed by another provider:   Independent interpretation of cervical spine x-rays demonstrates focal intervertebral involvement at the C5-6 and C6-7 levels with narrowing and anterior endplate osteophytes, subtle foraminal narrowing noted at the same, straightening of the expected cervical lordosis noted, no acute osseous processes noted  Independent interpretation of left shoulder x-rays dated 04/25/2022 reveals subtle AC joint osteoarthritis, otherwise type I acromion, no malalignment, no acute osseous processes visualized.  Procedures performed:   None  Pertinent History, Exam, Impression, and Recommendations:   Jordan Morton was seen today for shoulder pain.  Shoulder impingement Assessment & Plan: Right-hand-dominant patient presenting with left shoulder pain ongoing for 6-8 months, atraumatic in onset.  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  Examination with preserved rotator cuff strength, pain during empty can, positive impingement, tenderness at the bicipital groove, positive speeds, equivocal Yergason's, negative Spurling's.  Findings are most consistent with impingement syndrome with secondary  involvement of the biceps tendon, most likely tertiary involvement at the cervical spine.  Plan for scheduled meloxicam,.  Cyclobenzaprine, home-based rehab, close follow-up for reevaluation.  If suboptimal progress despite here is to plan, formal PT versus advanced imaging to be considered.  Orders: -     Meloxicam; Take 1 tablet (15 mg total) by mouth daily.  Dispense: 45 tablet; Refill: 0 -     Cyclobenzaprine HCl; Take 1 tablet (10 mg total) by mouth at bedtime as needed for muscle spasms.  Dispense: 45 tablet; Refill: 0     Orders & Medications Meds ordered this encounter  Medications   meloxicam (MOBIC) 15 MG tablet    Sig: Take 1 tablet (15 mg total) by mouth daily.    Dispense:  45 tablet    Refill:  0   cyclobenzaprine (FLEXERIL) 10 MG tablet    Sig: Take 1 tablet (10 mg total) by mouth at bedtime as needed for muscle spasms.    Dispense:  45 tablet    Refill:  0   No orders of the defined types were placed in this encounter.    Return in about 6 weeks (around 06/18/2022).     [name], MD, [facility]   Primary Care Sports Medicine Primary Care and Sports Medicine at [facility]

## 2022-05-21 DIAGNOSIS — H6123 Impacted cerumen, bilateral: Secondary | ICD-10-CM | POA: Diagnosis not present

## 2022-05-21 DIAGNOSIS — F17200 Nicotine dependence, unspecified, uncomplicated: Secondary | ICD-10-CM | POA: Diagnosis not present

## 2022-05-21 DIAGNOSIS — R0683 Snoring: Secondary | ICD-10-CM | POA: Diagnosis not present

## 2022-05-30 DIAGNOSIS — G4730 Sleep apnea, unspecified: Secondary | ICD-10-CM | POA: Diagnosis not present

## 2022-06-04 ENCOUNTER — Other Ambulatory Visit: Payer: Self-pay | Admitting: Family Medicine

## 2022-06-04 DIAGNOSIS — M25819 Other specified joint disorders, unspecified shoulder: Secondary | ICD-10-CM

## 2022-06-04 NOTE — Telephone Encounter (Signed)
Unable to refill per protocol, Rx request is too soon. Last refill 05/07/22 for 45 days.  Requested Prescriptions  Pending Prescriptions Disp Refills   meloxicam (MOBIC) 15 MG tablet [Pharmacy Med Name: MELOXICAM 15 MG TABLET] 30 tablet 1    Sig: TAKE 1 TABLET (15 MG TOTAL) BY MOUTH DAILY.     Analgesics:  COX2 Inhibitors Failed - 06/04/2022  1:29 AM      Failed - Manual Review: Labs are only required if the patient has taken medication for more than 8 weeks.      Failed - AST in normal range and within 360 days    AST  Date Value Ref Range Status  05/29/2021 18 0 - 40 IU/L Final         Failed - ALT in normal range and within 360 days    ALT  Date Value Ref Range Status  05/29/2021 16 0 - 44 IU/L Final         Passed - HGB in normal range and within 360 days    Hemoglobin  Date Value Ref Range Status  02/05/2022 16.1 13.0 - 17.7 g/dL Final         Passed - Cr in normal range and within 360 days    Creatinine, Ser  Date Value Ref Range Status  02/05/2022 0.85 0.76 - 1.27 mg/dL Final         Passed - HCT in normal range and within 360 days    Hematocrit  Date Value Ref Range Status  02/05/2022 45.5 37.5 - 51.0 % Final         Passed - eGFR is 30 or above and within 360 days    GFR calc Af Amer  Date Value Ref Range Status  04/28/2019 108 >59 mL/min/1.73 Final   GFR calc non Af Amer  Date Value Ref Range Status  04/28/2019 94 >59 mL/min/1.73 Final   eGFR  Date Value Ref Range Status  02/05/2022 97 >59 mL/min/1.73 Final         Passed - Patient is not pregnant      Passed - Valid encounter within last 12 months    Recent Outpatient Visits           4 weeks ago Shoulder impingement   [place] [facility] [place], Earley Abide, MD   1 month ago Arthralgia of left shoulder region   [place] [facility] [place], [place], MD   3 months ago Essential hypertension   [place] [facility] [place], Deanna C, MD   10 months ago Essential hypertension   [place] [facility] [place], Deanna C, MD   1 year ago Nausea   [facility] [place] [facility] [place], [place], MD       Future Appointments             In 2 weeks Zigmund Daniel, Earley Abide, MD [place] [facility], [place]   In 2 months Juline Patch, MD [place] at [facility], [facility]

## 2022-06-18 ENCOUNTER — Ambulatory Visit: Payer: 59 | Admitting: Family Medicine

## 2022-06-29 ENCOUNTER — Ambulatory Visit (INDEPENDENT_AMBULATORY_CARE_PROVIDER_SITE_OTHER): Payer: Medicare HMO | Admitting: Family Medicine

## 2022-06-29 ENCOUNTER — Inpatient Hospital Stay (INDEPENDENT_AMBULATORY_CARE_PROVIDER_SITE_OTHER): Payer: Medicare HMO | Admitting: Radiology

## 2022-06-29 ENCOUNTER — Encounter: Payer: Self-pay | Admitting: Family Medicine

## 2022-06-29 DIAGNOSIS — M25812 Other specified joint disorders, left shoulder: Secondary | ICD-10-CM | POA: Diagnosis not present

## 2022-06-29 DIAGNOSIS — M25819 Other specified joint disorders, unspecified shoulder: Secondary | ICD-10-CM

## 2022-06-29 MED ORDER — MELOXICAM 15 MG PO TABS: 15.0000 mg | ORAL_TABLET | Freq: Every day | ORAL | 1 refills | Status: DC | PRN

## 2022-06-29 MED ORDER — TRIAMCINOLONE ACETONIDE 40 MG/ML IJ SUSP
40.0000 mg | Freq: Once | INTRAMUSCULAR | Status: AC
  Administered 2022-06-29: 40 mg via INTRAMUSCULAR

## 2022-06-29 NOTE — Patient Instructions (Signed)
You have just been given a cortisone injection to reduce pain and inflammation. After the injection you may notice immediate relief of pain as a result of the Lidocaine. It is important to rest the area of the injection for 24 to 48 hours after the injection. There is a possibility of some temporary increased discomfort and swelling for up to 72 hours until the cortisone begins to work. If you do have pain, simply rest the joint and use ice. If you can tolerate over the counter medications, you can try Tylenol, Aleve, or Advil for added relief per package instructions. - Start home exercises after a few days - Can dose meloxicam as-needed - Follow-up as-needed

## 2022-06-29 NOTE — Assessment & Plan Note (Signed)
Patient returns for reevaluation, interim improved but still symptomatic with focality to the supraspinatus, there is impingement, no weakness. Patient did elect to proceed with subacromial corticosteroid injection, will start home rehab, transition to PRN meloxicam, return as-needed. Return symptoms can be addressed with consideration of modified pharmacotherapy, advanced imaging, and physical therapy.

## 2022-06-29 NOTE — Progress Notes (Signed)
     Primary Care / Sports Medicine Office Visit  Patient Information:  Patient ID: Jordan Morton, male DOB: 05/17/1955 Age: 67 y.o. MRN: [id number]   Jordan Morton is a pleasant 67 y.o. male presenting with the following:  Chief Complaint  Patient presents with   Shoulder impingement    Vitals:   06/29/22 0842  BP: 126/70  Pulse: 82  SpO2: 99%   Vitals:   06/29/22 0842  Weight: 162 lb (73.5 kg)  Height: 5\' 10"  (1.778 m)   Body mass index is 23.24 kg/m.  No results found.   Independent interpretation of notes and tests performed by another provider:   None  Procedures performed:   Procedure:  Injection of left subacromial injection under ultrasound guidance. Ultrasound guidance utilized for in-plane approach, there is subtle hypoechoic shadow most likely consistent with subdeltoid bursitis. Samsung HS60 device utilized with permanent recording / reporting. Verbal informed consent obtained and verified. Skin prepped in a sterile fashion. Ethyl chloride for topical local analgesia.  Completed without difficulty and tolerated well. Medication: triamcinolone acetonide 40 mg/mL suspension for injection 1 mL total and 2 mL lidocaine 1% without epinephrine utilized for needle placement anesthetic Advised to contact for fevers/chills, erythema, induration, drainage, or persistent bleeding.   Pertinent History, Exam, Impression, and Recommendations:   Jordan Morton was seen today for shoulder impingement.  Shoulder impingement Assessment & Plan: Patient returns for reevaluation, interim improved but still symptomatic with focality to the supraspinatus, there is impingement, no weakness. Patient did elect to proceed with subacromial corticosteroid injection, will start home rehab, transition to PRN meloxicam, return as-needed. Return symptoms can be addressed with consideration of modified pharmacotherapy, advanced imaging, and physical therapy.  Orders: -     Meloxicam;  Take 1 tablet (15 mg total) by mouth daily as needed for pain.  Dispense: 30 tablet; Refill: 1 -     [place] LIMITED JOINT SPACE STRUCTURES UP LEFT; Future -     Triamcinolone Acetonide     Orders & Medications Meds ordered this encounter  Medications   meloxicam (MOBIC) 15 MG tablet    Sig: Take 1 tablet (15 mg total) by mouth daily as needed for pain.    Dispense:  30 tablet    Refill:  1   triamcinolone acetonide (KENALOG-40) injection 40 mg   Orders Placed This Encounter  Procedures   [place] LIMITED JOINT SPACE STRUCTURES UP LEFT     No follow-ups on file.     [name], MD, [facility]   Primary Care Sports Medicine Primary Care and Sports Medicine at [facility]

## 2022-08-06 DIAGNOSIS — H5203 Hypermetropia, bilateral: Secondary | ICD-10-CM | POA: Diagnosis not present

## 2022-08-06 DIAGNOSIS — Z0100 Encounter for examination of eyes and vision without abnormal findings: Secondary | ICD-10-CM | POA: Diagnosis not present

## 2022-08-06 DIAGNOSIS — G4733 Obstructive sleep apnea (adult) (pediatric): Secondary | ICD-10-CM | POA: Diagnosis not present

## 2022-08-07 ENCOUNTER — Ambulatory Visit (INDEPENDENT_AMBULATORY_CARE_PROVIDER_SITE_OTHER): Payer: Medicare HMO | Admitting: Family Medicine

## 2022-08-07 ENCOUNTER — Encounter: Payer: Self-pay | Admitting: Family Medicine

## 2022-08-07 VITALS — BP 118/78 | HR 80 | Ht 70.0 in | Wt 168.0 lb

## 2022-08-07 DIAGNOSIS — I10 Essential (primary) hypertension: Secondary | ICD-10-CM | POA: Diagnosis not present

## 2022-08-07 DIAGNOSIS — K219 Gastro-esophageal reflux disease without esophagitis: Secondary | ICD-10-CM | POA: Diagnosis not present

## 2022-08-07 DIAGNOSIS — F331 Major depressive disorder, recurrent, moderate: Secondary | ICD-10-CM | POA: Diagnosis not present

## 2022-08-07 DIAGNOSIS — R110 Nausea: Secondary | ICD-10-CM | POA: Diagnosis not present

## 2022-08-07 DIAGNOSIS — R351 Nocturia: Secondary | ICD-10-CM

## 2022-08-07 DIAGNOSIS — N529 Male erectile dysfunction, unspecified: Secondary | ICD-10-CM | POA: Diagnosis not present

## 2022-08-07 MED ORDER — LISINOPRIL-HYDROCHLOROTHIAZIDE 10-12.5 MG PO TABS: 1.0000 | ORAL_TABLET | Freq: Every day | ORAL | 5 refills | Status: DC

## 2022-08-07 MED ORDER — SILDENAFIL CITRATE 100 MG PO TABS: 100.0000 mg | ORAL_TABLET | Freq: Every day | ORAL | 2 refills | Status: DC | PRN

## 2022-08-07 MED ORDER — PANTOPRAZOLE SODIUM 40 MG PO TBEC: 40.0000 mg | DELAYED_RELEASE_TABLET | Freq: Every day | ORAL | 5 refills | Status: DC

## 2022-08-07 MED ORDER — SERTRALINE HCL 50 MG PO TABS: 50.0000 mg | ORAL_TABLET | Freq: Every day | ORAL | 5 refills | Status: DC

## 2022-08-07 NOTE — Progress Notes (Signed)
Date:  08/07/2022   Name:  Jordan Morton   DOB:  [birth year]   MRN:  [id number]   Chief Complaint: Hypertension, Gastroesophageal Reflux, Depression, and Erectile Dysfunction  Hypertension This is a chronic problem. The current episode started more than 1 year ago. The problem has been gradually improving since onset. The problem is controlled. Pertinent negatives include no blurred vision, chest pain, headaches, palpitations, shortness of breath or sweats. There are no associated agents to hypertension. Past treatments include ACE inhibitors and diuretics. There are no compliance problems.  There is no history of CAD/MI or CVA.  Gastroesophageal Reflux He reports no abdominal pain, no chest pain, no choking, no heartburn or no wheezing. This is a chronic problem. The problem occurs rarely. The problem has been gradually improving. The symptoms are aggravated by certain foods. Pertinent negatives include no fatigue. He has tried a PPI and an antacid for the symptoms.  Depression        This is a chronic problem.  The onset quality is gradual.   The problem has been gradually improving since onset.  Associated symptoms include no decreased concentration, no fatigue, no helplessness, no hopelessness, does not have insomnia, not irritable, no restlessness, no decreased interest, no appetite change, no body aches, no myalgias, no headaches, no indigestion, not sad and no suicidal ideas.  Past treatments include SSRIs - Selective serotonin reuptake inhibitors.  Compliance with treatment is good.  Previous treatment provided moderate relief. Erectile Dysfunction This is a chronic problem. The problem has been gradually improving since onset. The nature of his difficulty is achieving erection. Irritative symptoms do not include frequency, nocturia or urgency. Obstructive symptoms do not include incomplete emptying, an intermittent stream or a weak stream.    Lab Results  Component Value Date   NA  136 02/05/2022   K 4.4 02/05/2022   CO2 25 02/05/2022   GLUCOSE 91 02/05/2022   BUN 6 (L) 02/05/2022   CREATININE 0.85 02/05/2022   CALCIUM 9.7 02/05/2022   EGFR 97 02/05/2022   GFRNONAA 94 04/28/2019   Lab Results  Component Value Date   CHOL 159 02/05/2022   HDL 66 02/05/2022   LDLCALC 81 02/05/2022   TRIG 58 02/05/2022   CHOLHDL 3.1 04/02/2018   Lab Results  Component Value Date   TSH 1.070 05/29/2021   No results found for: "HGBA1C" Lab Results  Component Value Date   WBC 9.8 02/05/2022   HGB 16.1 02/05/2022   HCT 45.5 02/05/2022   MCV 95 02/05/2022   PLT 436 02/05/2022   Lab Results  Component Value Date   ALT 16 05/29/2021   AST 18 05/29/2021   ALKPHOS 51 05/29/2021   BILITOT 0.4 05/29/2021   No results found for: "25OHVITD2", "25OHVITD3", "VD25OH"   Review of Systems  Constitutional:  Negative for appetite change, fatigue and unexpected weight change.  HENT:  Negative for trouble swallowing.   Eyes:  Negative for blurred vision and visual disturbance.  Respiratory:  Negative for choking, shortness of breath and wheezing.   Cardiovascular:  Negative for chest pain and palpitations.  Gastrointestinal:  Negative for abdominal pain and heartburn.  Endocrine: Negative for polydipsia and polyuria.  Genitourinary:  Negative for difficulty urinating, frequency, incomplete emptying, nocturia and urgency.  Musculoskeletal:  Negative for myalgias.  Neurological:  Negative for headaches.  Psychiatric/Behavioral:  Positive for depression. Negative for decreased concentration and suicidal ideas. The patient does not have insomnia.     Patient Active Problem  List   Diagnosis Date Noted   Shoulder impingement 05/07/2022   Erectile dysfunction 07/14/2021   Essential hypertension 05/22/2016   Depression, major, single episode, mild 05/22/2016   Hyperlipidemia 05/22/2016   Nocturia 05/22/2016    No Known Allergies  Past Surgical History:  Procedure Laterality  Date   COLONOSCOPY  2014   repeat in 5 years   COLONOSCOPY WITH PROPOFOL N/A 04/08/2018   Procedure: COLONOSCOPY WITH PROPOFOL;  Surgeon: Wyline Mood, MD;  Location: [facility];  Service: Gastroenterology;  Laterality: N/A;   TONSILLECTOMY      Social History   Tobacco Use   Smoking status: Every Day    Packs/day: 1.00    Years: 45.00    Additional pack years: 0.00    Total pack years: 45.00    Types: Cigarettes   Smokeless tobacco: Current    Types: Snuff  Vaping Use   Vaping Use: Never used  Substance Use Topics   Alcohol use: Yes    Alcohol/week: 56.0 standard drinks of alcohol    Types: 28 Cans of beer, 28 Standard drinks or equivalent per week   Drug use: No     Medication list has been reviewed and updated.  No outpatient medications have been marked as taking for the 08/07/22 encounter (Office Visit) with Duanne Limerick, MD.       08/07/2022    8:08 AM 04/24/2022    4:35 PM 02/05/2022    8:07 AM 08/04/2021    8:05 AM  GAD 7 : Generalized Anxiety Score  Nervous, Anxious, on Edge 0 0 0 0  Control/stop worrying 0 0 0 0  Worry too much - different things 0 0 0 0  Trouble relaxing 0 0 0 0  Restless 0 0 0 0  Easily annoyed or irritable 0 0 0 0  Afraid - awful might happen 0 0 0 0  Total GAD 7 Score 0 0 0 0  Anxiety Difficulty Not difficult at all Not difficult at all Not difficult at all Not difficult at all       08/07/2022    8:08 AM 04/24/2022    4:35 PM 02/05/2022    8:07 AM  Depression screen PHQ 2/9  Decreased Interest 0 0 0  Down, Depressed, Hopeless 0 0 0  PHQ - 2 Score 0 0 0  Altered sleeping 0 0 0  Tired, decreased energy 0 0 0  Change in appetite 0 0 0  Feeling bad or failure about yourself  0 0 0  Trouble concentrating 0 0 0  Moving slowly or fidgety/restless 0 0 0  Suicidal thoughts 0 0 0  PHQ-9 Score 0 0 0  Difficult doing work/chores Not difficult at all Not difficult at all Not difficult at all    BP Readings from Last 3  Encounters:  08/07/22 118/78  06/29/22 126/70  05/07/22 (!) 142/88    Physical Exam Vitals and nursing note reviewed.  Constitutional:      General: He is not irritable. HENT:     Head: Normocephalic.     Right Ear: External ear normal.     Left Ear: External ear normal.     Nose: Nose normal.     Mouth/Throat:     Mouth: Mucous membranes are moist.     Pharynx: No oropharyngeal exudate or posterior oropharyngeal erythema.  Eyes:     General: No scleral icterus.       Right eye: No discharge.  Left eye: No discharge.     Conjunctiva/sclera: Conjunctivae normal.     Pupils: Pupils are equal, round, and reactive to light.  Neck:     Thyroid: No thyromegaly.     Vascular: No JVD.     Trachea: No tracheal deviation.  Cardiovascular:     Rate and Rhythm: Normal rate and regular rhythm.     Heart sounds: Normal heart sounds. No murmur heard.    No friction rub. No gallop.  Pulmonary:     Effort: No respiratory distress.     Breath sounds: Normal breath sounds. No wheezing or rales.  Abdominal:     General: Bowel sounds are normal.     Palpations: Abdomen is soft. There is no mass.     Tenderness: There is no abdominal tenderness. There is no guarding or rebound.  Genitourinary:    Pubic Area: No rash or pubic lice.      Prostate: Normal. Not enlarged, not tender and no nodules present.     Rectum: Normal. Guaiac result negative. No mass.  Musculoskeletal:        General: No tenderness. Normal range of motion.     Cervical back: Normal range of motion and neck supple.  Lymphadenopathy:     Cervical: No cervical adenopathy.  Skin:    General: Skin is warm.     Findings: No rash.  Neurological:     Mental Status: He is alert and oriented to person, place, and time.     Cranial Nerves: No cranial nerve deficit.     Deep Tendon Reflexes: Reflexes are normal and symmetric.     Wt Readings from Last 3 Encounters:  08/07/22 168 lb (76.2 kg)  06/29/22 162 lb (73.5  kg)  05/07/22 165 lb (74.8 kg)    BP 118/78   Pulse 80   Ht  (1.778 m)   Wt 168 lb (76.2 kg)   SpO2 99%   BMI 24.11 kg/m   Assessment and Plan: 1. Essential hypertension Chronic.  Controlled.  Stable.  Blood pressure 118/78.  Asymptomatic.  Tolerating medication well.  Continue lisinopril hydrochlorothiazide 10-12.5 mg once a day.  Will check CMP for electrolytes and GFR.  Will recheck patient in 6 months. - lisinopril-hydrochlorothiazide (ZESTORETIC) 10-12.5 MG tablet; Take 1 tablet by mouth daily.  Dispense: 30 tablet; Refill: 5 - Comprehensive Metabolic Panel (CMET)  2. Nausea As noted below - pantoprazole (PROTONIX) 40 MG tablet; Take 1 tablet (40 mg total) by mouth daily.  Dispense: 30 tablet; Refill: 5  3. Gastroesophageal reflux disease, unspecified whether esophagitis present Monic.  Controlled.  Stable.  Continue pantoprazole 40 mg once a day.  Will recheck in 6 months. - pantoprazole (PROTONIX) 40 MG tablet; Take 1 tablet (40 mg total) by mouth daily.  Dispense: 30 tablet; Refill: 5  4. Moderate episode of recurrent major depressive disorder Chronic.  Controlled.  Stable.  PHQ is 0 GAD score is 0 continue sertraline 50 mg once a day.  Will recheck in 6 months. - sertraline (ZOLOFT) 50 MG tablet; Take 1 tablet (50 mg total) by mouth daily.  Dispense: 30 tablet; Refill: 5  5. Erectile dysfunction, unspecified erectile dysfunction type Chronic.  Controlled.  Stable.  Continue sildenafil at current dose, 100 mg as needed as needed. - sildenafil (VIAGRA) 100 MG tablet; Take 1 tablet (100 mg total) by mouth daily as needed for erectile dysfunction.  Dispense: 10 tablet; Refill: 2  6. Nocturia Patient with occasional nocturia.  DRE  notes mildly enlarged prostate with normal consistency without nodularity or tenderness.  Will check PSA for current level. - PSA     Elizabeth Sauer, MD

## 2022-08-08 LAB — COMPREHENSIVE METABOLIC PANEL
ALT: 21 IU/L (ref 0–44)
AST: 18 IU/L (ref 0–40)
Albumin/Globulin Ratio: 2.5 — ABNORMAL HIGH (ref 1.2–2.2)
Albumin: 4.8 g/dL (ref 3.9–4.9)
Alkaline Phosphatase: 49 IU/L (ref 44–121)
BUN/Creatinine Ratio: 10 (ref 10–24)
BUN: 8 mg/dL (ref 8–27)
Bilirubin Total: 0.5 mg/dL (ref 0.0–1.2)
CO2: 24 mmol/L (ref 20–29)
Calcium: 9.7 mg/dL (ref 8.6–10.2)
Chloride: 94 mmol/L — ABNORMAL LOW (ref 96–106)
Creatinine, Ser: 0.78 mg/dL (ref 0.76–1.27)
Globulin, Total: 1.9 g/dL (ref 1.5–4.5)
Glucose: 87 mg/dL (ref 70–99)
Potassium: 5 mmol/L (ref 3.5–5.2)
Sodium: 134 mmol/L (ref 134–144)
Total Protein: 6.7 g/dL (ref 6.0–8.5)
eGFR: 99 mL/min/{1.73_m2} (ref >59)

## 2022-08-08 LAB — PSA: Prostate Specific Ag, Serum: 1.9 ng/mL (ref 0.0–4.0)

## 2022-08-27 ENCOUNTER — Other Ambulatory Visit: Payer: Self-pay | Admitting: Family Medicine

## 2022-08-27 DIAGNOSIS — M25819 Other specified joint disorders, unspecified shoulder: Secondary | ICD-10-CM

## 2022-08-28 NOTE — Telephone Encounter (Signed)
Requested Prescriptions  Pending Prescriptions Disp Refills   meloxicam (MOBIC) 15 MG tablet [Pharmacy Med Name: MELOXICAM 15 MG TABLET] 90 tablet 2    Sig: TAKE 1 TABLET BY MOUTH EVERY DAY AS NEEDED FOR PAIN     Analgesics:  COX2 Inhibitors Failed - 08/27/2022  2:07 AM      Failed - Manual Review: Labs are only required if the patient has taken medication for more than 8 weeks.      Passed - HGB in normal range and within 360 days    Hemoglobin  Date Value Ref Range Status  02/05/2022 16.1 13.0 - 17.7 g/dL Final         Passed - Cr in normal range and within 360 days    Creatinine, Ser  Date Value Ref Range Status  08/07/2022 0.78 0.76 - 1.27 mg/dL Final         Passed - HCT in normal range and within 360 days    Hematocrit  Date Value Ref Range Status  02/05/2022 45.5 37.5 - 51.0 % Final         Passed - AST in normal range and within 360 days    AST  Date Value Ref Range Status  08/07/2022 18 0 - 40 IU/L Final         Passed - ALT in normal range and within 360 days    ALT  Date Value Ref Range Status  08/07/2022 21 0 - 44 IU/L Final         Passed - eGFR is 30 or above and within 360 days    GFR calc Af Amer  Date Value Ref Range Status  04/28/2019 108 >59 mL/min/1.73 Final   GFR calc non Af Amer  Date Value Ref Range Status  04/28/2019 94 >59 mL/min/1.73 Final   eGFR  Date Value Ref Range Status  08/07/2022 99 >59 mL/min/1.73 Final         Passed - Patient is not pregnant      Passed - Valid encounter within last 12 months    Recent Outpatient Visits           3 weeks ago Nocturia   [facility], [facility]   2 months ago Shoulder impingement   [facility], Ocie Bob, MD   3 months ago Shoulder impingement   [facility], Ocie Bob, MD   4 months ago Arthralgia of left shoulder  region   [facility] In[facility], MD   6 months ago Essential hypertension   [place] Inches, MD       Future Appointments             In 5 months Duanne Limerick, MD [facility] at [facility]

## 2023-02-07 ENCOUNTER — Telehealth: Payer: Self-pay

## 2023-02-07 ENCOUNTER — Other Ambulatory Visit: Payer: Self-pay

## 2023-02-07 ENCOUNTER — Encounter: Payer: Self-pay | Admitting: Family Medicine

## 2023-02-07 ENCOUNTER — Ambulatory Visit (INDEPENDENT_AMBULATORY_CARE_PROVIDER_SITE_OTHER): Payer: Medicare HMO | Admitting: Family Medicine

## 2023-02-07 VITALS — BP 122/64 | HR 75 | Ht 70.0 in | Wt 165.0 lb

## 2023-02-07 DIAGNOSIS — I10 Essential (primary) hypertension: Secondary | ICD-10-CM

## 2023-02-07 DIAGNOSIS — Z1211 Encounter for screening for malignant neoplasm of colon: Secondary | ICD-10-CM

## 2023-02-07 DIAGNOSIS — K219 Gastro-esophageal reflux disease without esophagitis: Secondary | ICD-10-CM | POA: Diagnosis not present

## 2023-02-07 DIAGNOSIS — N529 Male erectile dysfunction, unspecified: Secondary | ICD-10-CM

## 2023-02-07 DIAGNOSIS — F331 Major depressive disorder, recurrent, moderate: Secondary | ICD-10-CM

## 2023-02-07 DIAGNOSIS — Z23 Encounter for immunization: Secondary | ICD-10-CM | POA: Diagnosis not present

## 2023-02-07 DIAGNOSIS — Z860100 Personal history of colon polyps, unspecified: Secondary | ICD-10-CM

## 2023-02-07 MED ORDER — SERTRALINE HCL 50 MG PO TABS: 50.0000 mg | ORAL_TABLET | Freq: Every day | ORAL | 5 refills | Status: DC

## 2023-02-07 MED ORDER — SILDENAFIL CITRATE 100 MG PO TABS
100.0000 mg | ORAL_TABLET | Freq: Every day | ORAL | 2 refills | Status: DC | PRN
Start: 2023-02-07 — End: 2023-08-08

## 2023-02-07 MED ORDER — NA SULFATE-K SULFATE-MG SULF 17.5-3.13-1.6 GM/177ML PO SOLN: 1.0000 | Freq: Once | ORAL | 0 refills | Status: AC

## 2023-02-07 MED ORDER — LISINOPRIL-HYDROCHLOROTHIAZIDE 10-12.5 MG PO TABS: 1.0000 | ORAL_TABLET | Freq: Every day | ORAL | 5 refills | Status: DC

## 2023-02-07 NOTE — Telephone Encounter (Signed)
Gastroenterology Pre-Procedure Review  Request Date: 04/09/23 Requesting Physician: Dr. Servando Snare  PATIENT REVIEW QUESTIONS: The patient responded to the following health history questions as indicated:    1. Are you having any GI issues? no 2. Do you have a personal history of Polyps? yes (last colonoscopy performed by Dr. Tobi Bastos 04/08/2018 recommended repeat in 5 years) 3. Do you have a family history of Colon Cancer or Polyps? no 4. Diabetes Mellitus? no 5. Joint replacements in the past 12 months?no 6. Major health problems in the past 3 months?no 7. Any artificial heart valves, MVP, or defibrillator?no    MEDICATIONS & ALLERGIES:    Patient reports the following regarding taking any anticoagulation/antiplatelet therapy:   Plavix, Coumadin, Eliquis, Xarelto, Lovenox, Pradaxa, Brilinta, or Effient? no Aspirin? no  Patient confirms/reports the following medications:  Current Outpatient Medications  Medication Sig Dispense Refill   aspirin EC 81 MG tablet Take 1 tablet (81 mg total) by mouth daily. 100 tablet 3   cyclobenzaprine (FLEXERIL) 10 MG tablet Take 1 tablet (10 mg total) by mouth at bedtime as needed for muscle spasms. 45 tablet 0   lisinopril-hydrochlorothiazide (ZESTORETIC) 10-12.5 MG tablet Take 1 tablet by mouth daily. 30 tablet 5   meloxicam (MOBIC) 15 MG tablet TAKE 1 TABLET BY MOUTH EVERY DAY AS NEEDED FOR PAIN 90 tablet 2   Multiple Vitamins-Minerals (MULTIVITAMIN ADULTS 50+ PO) Take 1 each by mouth daily.     pantoprazole (PROTONIX) 40 MG tablet Take 1 tablet (40 mg total) by mouth daily. 30 tablet 5   sertraline (ZOLOFT) 50 MG tablet Take 1 tablet (50 mg total) by mouth daily. 30 tablet 5   sildenafil (VIAGRA) 100 MG tablet Take 1 tablet (100 mg total) by mouth daily as needed for erectile dysfunction. 10 tablet 2   No current facility-administered medications for this visit.    Patient confirms/reports the following allergies:  No Known Allergies  No orders of the  defined types were placed in this encounter.   AUTHORIZATION INFORMATION Primary Insurance: 1D#: Group #:  Secondary Insurance: 1D#: Group #:  SCHEDULE INFORMATION: Date: 04/09/23 Time: Location: [facility]

## 2023-02-07 NOTE — Progress Notes (Signed)
Date:  02/07/2023   Name:  Jordan Morton   DOB:  June 02, 1955   MRN:  [id number]   Chief Complaint: Hypertension, Anxiety, and Erectile Dysfunction  Hypertension This is a chronic problem. The current episode started more than 1 year ago. The problem has been gradually improving since onset. The problem is controlled. Associated symptoms include anxiety. Pertinent negatives include no blurred vision, chest pain, headaches, malaise/fatigue, neck pain, orthopnea, palpitations, peripheral edema, shortness of breath or sweats. There are no associated agents to hypertension. There are no known risk factors for coronary artery disease. Past treatments include ACE inhibitors and diuretics. The current treatment provides moderate improvement. There are no compliance problems.  There is no history of angina, kidney disease, CAD/MI, CVA, heart failure, left ventricular hypertrophy, PVD or retinopathy. There is no history of chronic renal disease, a hypertension causing med or renovascular disease.  Anxiety Presents for follow-up visit. Patient reports no chest pain, compulsions, confusion, decreased concentration, depressed mood, dizziness, dry mouth, excessive worry, feeling of choking, hyperventilation, impotence, insomnia, irritability, malaise, muscle tension, nausea, nervous/anxious behavior, obsessions, palpitations, panic, restlessness, shortness of breath or suicidal ideas. Symptoms occur occasionally. The quality of sleep is good.    Erectile Dysfunction This is a chronic problem. The current episode started more than 1 year ago. The nature of his difficulty is achieving erection. He reports no anxiety. Irritative symptoms do not include frequency or urgency. Obstructive symptoms do not include dribbling, incomplete emptying, an intermittent stream, a slower stream, straining or a weak stream. Pertinent negatives include no chills, dysuria or hematuria. Past treatments include sildenafil. The  treatment provided moderate relief.    Lab Results  Component Value Date   NA 134 08/07/2022   K 5.0 08/07/2022   CO2 24 08/07/2022   GLUCOSE 87 08/07/2022   BUN 8 08/07/2022   CREATININE 0.78 08/07/2022   CALCIUM 9.7 08/07/2022   EGFR 99 08/07/2022   GFRNONAA 94 04/28/2019   Lab Results  Component Value Date   CHOL 159 02/05/2022   HDL 66 02/05/2022   LDLCALC 81 02/05/2022   TRIG 58 02/05/2022   CHOLHDL 3.1 04/02/2018   Lab Results  Component Value Date   TSH 1.070 05/29/2021   No results found for: "HGBA1C" Lab Results  Component Value Date   WBC 9.8 02/05/2022   HGB 16.1 02/05/2022   HCT 45.5 02/05/2022   MCV 95 02/05/2022   PLT 436 02/05/2022   Lab Results  Component Value Date   ALT 21 08/07/2022   AST 18 08/07/2022   ALKPHOS 49 08/07/2022   BILITOT 0.5 08/07/2022   No results found for: "25OHVITD2", "25OHVITD3", "VD25OH"   Review of Systems  Constitutional:  Negative for chills, fever, irritability and malaise/fatigue.  HENT:  Negative for drooling, ear discharge, ear pain and sore throat.   Eyes:  Negative for blurred vision.  Respiratory:  Negative for cough, shortness of breath and wheezing.   Cardiovascular:  Negative for chest pain, palpitations, orthopnea and leg swelling.  Gastrointestinal:  Negative for abdominal pain, blood in stool, constipation, diarrhea and nausea.  Endocrine: Negative for polydipsia.  Genitourinary:  Negative for dysuria, frequency, hematuria, impotence, incomplete emptying and urgency.  Musculoskeletal:  Negative for back pain, myalgias and neck pain.  Skin:  Negative for rash.  Allergic/Immunologic: Negative for environmental allergies.  Neurological:  Negative for dizziness and headaches.  Hematological:  Does not bruise/bleed easily.  Psychiatric/Behavioral:  Negative for confusion, decreased concentration and suicidal ideas. The  patient is not nervous/anxious and does not have insomnia.     Patient Active Problem  List   Diagnosis Date Noted   Shoulder impingement 05/07/2022   Erectile dysfunction 07/14/2021   Essential hypertension 05/22/2016   Depression, major, single episode, mild (HCC) 05/22/2016   Hyperlipidemia 05/22/2016   Nocturia 05/22/2016    No Known Allergies  Past Surgical History:  Procedure Laterality Date   COLONOSCOPY  2014   repeat in 5 years   COLONOSCOPY WITH PROPOFOL N/A 04/08/2018   Procedure: COLONOSCOPY WITH PROPOFOL;  Surgeon: Wyline Mood, MD;  Location: [facility] ENDOSCOPY;  Service: Gastroenterology;  Laterality: N/A;   TONSILLECTOMY      Social History   Tobacco Use   Smoking status: Every Day    Current packs/day: 1.00    Average packs/day: 1 pack/day for 45.0 years (45.0 ttl pk-yrs)    Types: Cigarettes   Smokeless tobacco: Current    Types: Snuff  Vaping Use   Vaping status: Never Used  Substance Use Topics   Alcohol use: Yes    Alcohol/week: 56.0 standard drinks of alcohol    Types: 28 Cans of beer, 28 Standard drinks or equivalent per week   Drug use: No     Medication list has been reviewed and updated.  Current Meds  Medication Sig   aspirin EC 81 MG tablet Take 1 tablet (81 mg total) by mouth daily.   cyclobenzaprine (FLEXERIL) 10 MG tablet Take 1 tablet (10 mg total) by mouth at bedtime as needed for muscle spasms.   lisinopril-hydrochlorothiazide (ZESTORETIC) 10-12.5 MG tablet Take 1 tablet by mouth daily.   meloxicam (MOBIC) 15 MG tablet TAKE 1 TABLET BY MOUTH EVERY DAY AS NEEDED FOR PAIN   Multiple Vitamins-Minerals (MULTIVITAMIN ADULTS 50+ PO) Take 1 each by mouth daily.   pantoprazole (PROTONIX) 40 MG tablet Take 1 tablet (40 mg total) by mouth daily.   sertraline (ZOLOFT) 50 MG tablet Take 1 tablet (50 mg total) by mouth daily.   sildenafil (VIAGRA) 100 MG tablet Take 1 tablet (100 mg total) by mouth daily as needed for erectile dysfunction.       02/07/2023    8:07 AM 08/07/2022    8:08 AM 04/24/2022    4:35 PM 02/05/2022    8:07  AM  GAD 7 : Generalized Anxiety Score  Nervous, Anxious, on Edge 0 0 0 0  Control/stop worrying 0 0 0 0  Worry too much - different things 0 0 0 0  Trouble relaxing 0 0 0 0  Restless 0 0 0 0  Easily annoyed or irritable 0 0 0 0  Afraid - awful might happen 0 0 0 0  Total GAD 7 Score 0 0 0 0  Anxiety Difficulty Not difficult at all Not difficult at all Not difficult at all Not difficult at all       02/07/2023    8:06 AM 08/07/2022    8:08 AM 04/24/2022    4:35 PM  Depression screen PHQ 2/9  Decreased Interest 0 0 0  Down, Depressed, Hopeless 0 0 0  PHQ - 2 Score 0 0 0  Altered sleeping 0 0 0  Tired, decreased energy 0 0 0  Change in appetite 0 0 0  Feeling bad or failure about yourself  0 0 0  Trouble concentrating 0 0 0  Moving slowly or fidgety/restless 0 0 0  Suicidal thoughts 0 0 0  PHQ-9 Score 0 0 0  Difficult doing work/chores Not difficult  at all Not difficult at all Not difficult at all    BP Readings from Last 3 Encounters:  02/07/23 122/64  08/07/22 118/78  06/29/22 126/70    Physical Exam Vitals and nursing note reviewed.  HENT:     Head: Normocephalic.     Right Ear: Tympanic membrane and external ear normal.     Left Ear: Tympanic membrane and external ear normal.     Nose: Nose normal.     Mouth/Throat:     Mouth: Mucous membranes are moist.  Eyes:     General: No scleral icterus.       Right eye: No discharge.        Left eye: No discharge.     Conjunctiva/sclera: Conjunctivae normal.     Pupils: Pupils are equal, round, and reactive to light.  Neck:     Thyroid: No thyromegaly.     Vascular: No JVD.     Trachea: No tracheal deviation.  Cardiovascular:     Rate and Rhythm: Normal rate and regular rhythm.     Heart sounds: Normal heart sounds. No murmur heard.    No friction rub. No gallop.  Pulmonary:     Effort: No respiratory distress.     Breath sounds: Normal breath sounds. No wheezing, rhonchi or rales.  Abdominal:     General: Bowel  sounds are normal.     Palpations: Abdomen is soft. There is no mass.     Tenderness: There is no abdominal tenderness. There is no guarding or rebound.  Musculoskeletal:        General: No tenderness. Normal range of motion.     Cervical back: Normal range of motion and neck supple.  Lymphadenopathy:     Cervical: No cervical adenopathy.  Skin:    General: Skin is warm.     Findings: No rash.  Neurological:     Mental Status: He is alert and oriented to person, place, and time.     Cranial Nerves: No cranial nerve deficit.     Deep Tendon Reflexes: Reflexes are normal and symmetric.     Wt Readings from Last 3 Encounters:  02/07/23 165 lb (74.8 kg)  08/07/22 168 lb (76.2 kg)  06/29/22 162 lb (73.5 kg)    BP 122/64   Pulse 75   Ht 5\' 10"  (1.778 m)   Wt 165 lb (74.8 kg)   SpO2 97%   BMI 23.68 kg/m   Assessment and Plan:  1. Essential hypertension Chronic.  Controlled.  Stable.  Blood pressure 122/64.  Asymptomatic.  Tolerating medication well.  Continue lisinopril hydrochlorothiazide 10-12.5 mg once a day.  Will recheck in 6 months. - lisinopril-hydrochlorothiazide (ZESTORETIC) 10-12.5 MG tablet; Take 1 tablet by mouth daily.  Dispense: 30 tablet; Refill: 5  2. Moderate episode of recurrent major depressive disorder (HCC) Chronic.  Controlled.  Stable.  PHQ 0 GAD score 0 continue sertraline once a day. - sertraline (ZOLOFT) 50 MG tablet; Take 1 tablet (50 mg total) by mouth daily.  Dispense: 30 tablet; Refill: 5  3. Erectile dysfunction, unspecified erectile dysfunction type Chronic.  Controlled.  Stable open patient with excellent results on current dosing of sildenafil 100 mg as needed as needed.  This will be refilled on as-needed basis. - sildenafil (VIAGRA) 100 MG tablet; Take 1 tablet (100 mg total) by mouth daily as needed for erectile dysfunction.  Dispense: 10 tablet; Refill: 2  4. Gastroesophageal reflux disease, unspecified whether esophagitis present Patient  currently taking pantoprazole  for reflux has plenty of medications because he is taking them more or less on a as needed basis in which we will continue to do so and prescribe accordingly.  5. Colon cancer screening Discussed with patient and referral placed with Dr. Daleen Squibb and that he is due for colonoscopy surveillance. - Ambulatory referral to Gastroenterology  6. Need for influenza vaccination Discussed and administered. - Flu Vaccine Trivalent High Dose (Fluad)    Elizabeth Sauer, MD

## 2023-04-02 ENCOUNTER — Other Ambulatory Visit: Payer: Self-pay | Admitting: Family Medicine

## 2023-04-02 DIAGNOSIS — M25819 Other specified joint disorders, unspecified shoulder: Secondary | ICD-10-CM

## 2023-04-03 NOTE — Telephone Encounter (Signed)
Requested medication (s) are due for refill today: yes  Requested medication (s) are on the active medication list: yes  Last refill:  04/27/22 #45  Future visit scheduled: yes  Notes to clinic:  med not delegated to NT to RF   Requested Prescriptions  Pending Prescriptions Disp Refills   cyclobenzaprine (FLEXERIL) 10 MG tablet [Pharmacy Med Name: CYCLOBENZAPRINE 10 MG TABLET] 30 tablet 1    Sig: TAKE 1 TABLET BY MOUTH AT BEDTIME AS NEEDED FOR MUSCLE SPASMS     Not Delegated - Analgesics:  Muscle Relaxants Failed - 04/02/2023  1:11 PM      Failed - This refill cannot be delegated      Passed - Valid encounter within last 6 months    Recent Outpatient Visits           1 month ago Essential hypertension   [facility] In[facility], MD   7 months ago Nocturia   [facility] at [facility], MD   9 months ago Shoulder impingement   [facility] at [facility], Ocie Bob, MD   11 months ago Shoulder impingement   [facility] at [facility] Royalty, Ocie Bob, MD   11 months ago Arthralgia of left shoulder region   [facility] In[facility], MD       Future Appointments             In 4 months Duanne Limerick, MD [facility] at [facility], [facility]

## 2023-04-08 ENCOUNTER — Encounter: Payer: Self-pay | Admitting: Gastroenterology

## 2023-04-09 ENCOUNTER — Encounter
Admission: RE | Disposition: A | Discharge status: Home / Self Care | Payer: Self-pay | Source: Ambulatory Visit | Attending: Gastroenterology

## 2023-04-09 ENCOUNTER — Ambulatory Visit
Admission: RE | Admit: 2023-04-09 | Discharge: 2023-04-09 | Disposition: A | Discharge status: Home / Self Care | Payer: Medicare HMO | Source: Ambulatory Visit | Attending: Gastroenterology | Admitting: Gastroenterology

## 2023-04-09 ENCOUNTER — Ambulatory Visit: Payer: Medicare HMO | Admitting: Certified Registered"

## 2023-04-09 ENCOUNTER — Other Ambulatory Visit: Payer: Self-pay

## 2023-04-09 ENCOUNTER — Encounter: Payer: Self-pay | Admitting: Gastroenterology

## 2023-04-09 ENCOUNTER — Other Ambulatory Visit: Payer: Self-pay | Admitting: Gastroenterology

## 2023-04-09 DIAGNOSIS — Z1211 Encounter for screening for malignant neoplasm of colon: Secondary | ICD-10-CM | POA: Diagnosis not present

## 2023-04-09 DIAGNOSIS — F17210 Nicotine dependence, cigarettes, uncomplicated: Secondary | ICD-10-CM | POA: Insufficient documentation

## 2023-04-09 DIAGNOSIS — K5730 Diverticulosis of large intestine without perforation or abscess without bleeding: Secondary | ICD-10-CM

## 2023-04-09 DIAGNOSIS — Z860101 Personal history of adenomatous and serrated colon polyps: Secondary | ICD-10-CM | POA: Diagnosis not present

## 2023-04-09 DIAGNOSIS — Z09 Encounter for follow-up examination after completed treatment for conditions other than malignant neoplasm: Secondary | ICD-10-CM | POA: Insufficient documentation

## 2023-04-09 DIAGNOSIS — I10 Essential (primary) hypertension: Secondary | ICD-10-CM | POA: Diagnosis not present

## 2023-04-09 DIAGNOSIS — D122 Benign neoplasm of ascending colon: Secondary | ICD-10-CM | POA: Diagnosis not present

## 2023-04-09 DIAGNOSIS — K635 Polyp of colon: Secondary | ICD-10-CM | POA: Diagnosis not present

## 2023-04-09 DIAGNOSIS — Z79899 Other long term (current) drug therapy: Secondary | ICD-10-CM | POA: Diagnosis not present

## 2023-04-09 DIAGNOSIS — Z860100 Personal history of colon polyps, unspecified: Secondary | ICD-10-CM | POA: Diagnosis not present

## 2023-04-09 DIAGNOSIS — F32A Depression, unspecified: Secondary | ICD-10-CM | POA: Insufficient documentation

## 2023-04-09 DIAGNOSIS — D126 Benign neoplasm of colon, unspecified: Secondary | ICD-10-CM

## 2023-04-09 HISTORY — PX: BIOPSY: SHX5522

## 2023-04-09 HISTORY — PX: COLONOSCOPY WITH PROPOFOL: SHX5780

## 2023-04-09 SURGERY — COLONOSCOPY WITH PROPOFOL
Anesthesia: General

## 2023-04-09 MED ORDER — SODIUM CHLORIDE 0.9 % IV SOLN: INTRAVENOUS | Status: DC

## 2023-04-09 MED ORDER — LIDOCAINE HCL (PF) 2 % IJ SOLN
INTRAMUSCULAR | Status: DC | PRN
  Administered 2023-04-09: 50 mg via INTRADERMAL

## 2023-04-09 MED ORDER — PROPOFOL 1000 MG/100ML IV EMUL
INTRAVENOUS | Status: AC
  Filled 2023-04-09: qty 100

## 2023-04-09 MED ORDER — PROPOFOL 10 MG/ML IV BOLUS
INTRAVENOUS | Status: AC
  Filled 2023-04-09: qty 20

## 2023-04-09 MED ORDER — PROPOFOL 500 MG/50ML IV EMUL
INTRAVENOUS | Status: DC | PRN
  Administered 2023-04-09: 50 mg via INTRAVENOUS
  Administered 2023-04-09: 150 ug/kg/min via INTRAVENOUS

## 2023-04-09 NOTE — Transfer of Care (Signed)
Immediate Anesthesia Transfer of Care Note  Patient: XABI WEIPERT  Procedure(s) Performed: COLONOSCOPY WITH PROPOFOL BIOPSY  Patient Location: PACU  Anesthesia Type:General  Level of Consciousness: awake, alert , and oriented  Airway & Oxygen Therapy: Patient Spontanous Breathing  Post-op Assessment: Report given to RN and Post -op Vital signs reviewed and stable  Post vital signs: stable  Last Vitals:  Vitals Value Taken Time  BP 99/73 04/09/23 0814  Temp 36.2 C 04/09/23 0811  Pulse 95 04/09/23 0815  Resp    SpO2 99 % 04/09/23 0815  Vitals shown include unfiled device data.  Last Pain:  Vitals:   04/09/23 0811  TempSrc: Temporal  PainSc: Asleep         Complications: No notable events documented.

## 2023-04-09 NOTE — H&P (Signed)
Wyline Mood, MD [address], [place], [place], [address] [address], [address], [place], [place], [address] Phone: [phone]  Fax: [phone]  Primary Care Physician:  Duanne Limerick, MD   Pre-Procedure History & Physical: HPI:  Jordan Morton is a 67 y.o. male is here for an colonoscopy.   Past Medical History:  Diagnosis Date   Depression    Hypertension     Past Surgical History:  Procedure Laterality Date   COLONOSCOPY  2014   repeat in 5 years   COLONOSCOPY WITH PROPOFOL N/A 04/08/2018   Procedure: COLONOSCOPY WITH PROPOFOL;  Surgeon: Wyline Mood, MD;  Location: [facility] ENDOSCOPY;  Service: Gastroenterology;  Laterality: N/A;   TONSILLECTOMY      Prior to Admission medications   Medication Sig Start Date End Date Taking? Authorizing Provider  aspirin EC 81 MG tablet Take 1 tablet (81 mg total) by mouth daily. 04/28/19  Yes Duanne Limerick, MD  cyclobenzaprine (FLEXERIL) 10 MG tablet Take 1 tablet (10 mg total) by mouth at bedtime as needed for muscle spasms. 05/07/22  Yes Jerrol Banana, MD  lisinopril-hydrochlorothiazide (ZESTORETIC) 10-12.5 MG tablet Take 1 tablet by mouth daily. 02/07/23  Yes Duanne Limerick, MD  meloxicam (MOBIC) 15 MG tablet TAKE 1 TABLET BY MOUTH EVERY DAY AS NEEDED FOR PAIN 08/28/22  Yes Duanne Limerick, MD  Multiple Vitamins-Minerals (MULTIVITAMIN ADULTS 50+ PO) Take 1 each by mouth daily.   Yes [provider]  pantoprazole (PROTONIX) 40 MG tablet Take 1 tablet (40 mg total) by mouth daily. 08/07/22  Yes Duanne Limerick, MD  sertraline (ZOLOFT) 50 MG tablet Take 1 tablet (50 mg total) by mouth daily. 02/07/23  Yes Duanne Limerick, MD  sildenafil (VIAGRA) 100 MG tablet Take 1 tablet (100 mg total) by mouth daily as needed for erectile dysfunction. 02/07/23   Duanne Limerick, MD    Allergies as of 02/08/2023   (No Known Allergies)    Family History  Problem Relation Age of Onset   Cancer Mother    Cancer Father      Social History   Socioeconomic History   Marital status: Widowed    Spouse name: Not on file   Number of children: Not on file   Years of education: Not on file   Highest education level: 12th grade  Occupational History   Not on file  Tobacco Use   Smoking status: Every Day    Current packs/day: 1.00    Average packs/day: 1 pack/day for 45.0 years (45.0 ttl pk-yrs)    Types: Cigarettes   Smokeless tobacco: Current    Types: Snuff  Vaping Use   Vaping status: Never Used  Substance and Sexual Activity   Alcohol use: Yes    Alcohol/week: 56.0 standard drinks of alcohol    Types: 28 Cans of beer, 28 Standard drinks or equivalent per week   Drug use: No   Sexual activity: Yes  Other Topics Concern   Not on file  Social History Narrative   Not on file   Social Drivers of Health   Financial Resource Strain: Low Risk  (02/06/2023)   Overall Financial Resource Strain (CARDIA)    Difficulty of Paying Living Expenses: Not hard at all  Food Insecurity: No Food Insecurity (02/06/2023)   Hunger Vital Sign    Worried About Running Out of Food in the Last Year: Never true    Ran Out of Food in the Last Year: Never  true  Transportation Needs: No Transportation Needs (02/06/2023)   PRAPARE - Administrator, Civil Service (Medical): No    Lack of Transportation (Non-Medical): No  Physical Activity: Insufficiently Active (02/06/2023)   Exercise Vital Sign    Days of Exercise per Week: 7 days    Minutes of Exercise per Session: 20 min  Stress: No Stress Concern Present (02/06/2023)   [employer] of Occupational Health - Occupational Stress Questionnaire    Feeling of Stress : Not at all  Social Connections: Moderately Integrated (02/06/2023)   Social Connection and Isolation Panel [NHANES]    Frequency of Communication with Friends and Family: More than three times a week    Frequency of Social Gatherings with Friends and Family: Twice a week    Attends  Religious Services: More than 4 times per year    Active Member of [employer] or Organizations: No    Attends Engineer, structural: Not on file    Marital Status: Living with partner  Intimate Partner Violence: Not on file    Review of Systems: See HPI, otherwise negative ROS  Physical Exam: BP (!) 140/96   Pulse 89   Temp 97.6 F (36.4 C) (Temporal)   Wt 74.8 kg   SpO2 100%   BMI 23.68 kg/m  General:   Alert,  pleasant and cooperative in NAD Head:  Normocephalic and atraumatic. Neck:  Supple; no masses or thyromegaly. Lungs:  Clear throughout to auscultation, normal respiratory effort.    Heart:  +S1, +S2, Regular rate and rhythm, No edema. Abdomen:  Soft, nontender and nondistended. Normal bowel sounds, without guarding, and without rebound.   Neurologic:  Alert and  oriented x4;  grossly normal neurologically.  Impression/Plan: Jordan Morton is here for an colonoscopy to be performed for surveillance due to prior history of colon polyps   Risks, benefits, limitations, and alternatives regarding  colonoscopy have been reviewed with the patient.  Questions have been answered.  All parties agreeable.   Wyline Mood, MD  04/09/2023, 7:49 AM

## 2023-04-09 NOTE — Anesthesia Preprocedure Evaluation (Signed)
Anesthesia Evaluation  Patient identified by MRN, date of birth, ID band Patient awake    Reviewed: Allergy & Precautions, H&P , NPO status , Patient's Chart, lab work & pertinent test results, reviewed documented beta blocker date and time   History of Anesthesia Complications Negative for: history of anesthetic complications  Airway Mallampati: II   Neck ROM: full    Dental  (+) Poor Dentition, Dental Advidsory Given   Pulmonary neg shortness of breath, neg COPD, neg recent URI, Current Smoker   Pulmonary exam normal        Cardiovascular Exercise Tolerance: Good hypertension, On Medications (-) angina (-) Past MI and (-) Cardiac Stents Normal cardiovascular exam(-) dysrhythmias (-) Valvular Problems/Murmurs Rhythm:regular Rate:Normal     Neuro/Psych  PSYCHIATRIC DISORDERS  Depression    negative neurological ROS     GI/Hepatic negative GI ROS, Neg liver ROS,,,  Endo/Other  negative endocrine ROS    Renal/GU negative Renal ROS  negative genitourinary   Musculoskeletal   Abdominal   Peds  Hematology negative hematology ROS (+)   Anesthesia Other Findings Past Medical History: No date: Depression No date: Hypertension Past Surgical History: 2014: COLONOSCOPY     Comment:  repeat in 5 years No date: TONSILLECTOMY   Reproductive/Obstetrics negative OB ROS                             Anesthesia Physical Anesthesia Plan  ASA: 2  Anesthesia Plan: General   Post-op Pain Management:    Induction: Intravenous  PONV Risk Score and Plan: 1 and Propofol infusion and TIVA  Airway Management Planned: Natural Airway and Nasal Cannula  Additional Equipment:   Intra-op Plan:   Post-operative Plan:   Informed Consent: I have reviewed the patients History and Physical, chart, labs and discussed the procedure including the risks, benefits and alternatives for the proposed anesthesia  with the patient or authorized representative who has indicated his/her understanding and acceptance.     Dental Advisory Given  Plan Discussed with: CRNA  Anesthesia Plan Comments:         Anesthesia Quick Evaluation

## 2023-04-09 NOTE — Op Note (Signed)
[facility] Gastroenterology Patient Name: Jordan Morton Procedure Date: 04/09/2023 7:29 AM MRN: [id number] Account #: [id number] Date of Birth: January 19, 1956 Admit Type: Outpatient Age: 67 Room: [facility] ENDO ROOM 2 Gender: Male Note Status: Finalized Instrument Name: Prentice Docker [id number] Procedure:             Colonoscopy Indications:           Surveillance: Personal history of colonic polyps                         (unknown histology) on last colonoscopy more than 5                         years ago Providers:             Wyline Mood MD, MD Medicines:             Monitored Anesthesia Care Complications:         No immediate complications. Procedure:             Pre-Anesthesia Assessment:                        - Prior to the procedure, a History and Physical was                         performed, and patient medications, allergies and                         sensitivities were reviewed. The patient's tolerance                         of previous anesthesia was reviewed.                        - The risks and benefits of the procedure and the                         sedation options and risks were discussed with the                         patient. All questions were answered and informed                         consent was obtained.                        - ASA Grade Assessment: II - A patient with mild                         systemic disease.                        After obtaining informed consent, the colonoscope was                         passed under direct vision. Throughout the procedure,                         the patient's blood pressure, pulse, and oxygen  saturations were monitored continuously. The                         Colonoscope was introduced through the anus and                         advanced to the the cecum, identified by the                         appendiceal orifice. The colonoscopy was performed                          with ease. The patient tolerated the procedure well.                         The quality of the bowel preparation was excellent.                         The ileocecal valve, appendiceal orifice, and rectum                         were photographed. Findings:      The perianal and digital rectal examinations were normal.      A 3 mm polyp was found in the ascending colon. The polyp was sessile.       The polyp was removed with a cold biopsy forceps. Resection and       retrieval were complete.      The exam was otherwise without abnormality on direct and retroflexion       views.      Multiple medium-mouthed diverticula were found in the sigmoid colon. Impression:            - One 3 mm polyp in the ascending colon, removed with                         a cold biopsy forceps. Resected and retrieved.                        - The examination was otherwise normal on direct and                         retroflexion views. Recommendation:        - Discharge patient to home (with escort).                        - Resume previous diet.                        - Continue present medications.                        - Await pathology results.                        - Repeat colonoscopy for surveillance based on                         pathology results. Procedure Code(s):     --- Professional ---  98119, Colonoscopy, flexible; with biopsy, single or                         multiple Diagnosis Code(s):     --- Professional ---                        Z86.010, Personal history of colonic polyps                        D12.2, Benign neoplasm of ascending colon CPT copyright 2022 American Medical Association. All rights reserved. The codes documented in this report are preliminary and upon coder review may  be revised to meet current compliance requirements. Wyline Mood, MD Wyline Mood MD, MD 04/09/2023 8:10:55 AM This report has been signed electronically. Number of Addenda:  0 Note Initiated On: 04/09/2023 7:29 AM Scope Withdrawal Time: 0 hours 12 minutes 50 seconds  Total Procedure Duration: 0 hours 15 minutes 17 seconds  Estimated Blood Loss:  Estimated blood loss: none. Estimated blood loss: none.      [facility]

## 2023-04-10 ENCOUNTER — Encounter: Payer: Self-pay | Admitting: Gastroenterology

## 2023-04-10 LAB — SURGICAL PATHOLOGY

## 2023-04-11 NOTE — Anesthesia Postprocedure Evaluation (Signed)
Anesthesia Post Note  Patient: Jordan Morton  Procedure(s) Performed: COLONOSCOPY WITH PROPOFOL BIOPSY  Patient location during evaluation: Endoscopy Anesthesia Type: General Level of consciousness: awake and alert Pain management: pain level controlled Vital Signs Assessment: post-procedure vital signs reviewed and stable Respiratory status: spontaneous breathing, nonlabored ventilation, respiratory function stable and patient connected to nasal cannula oxygen Cardiovascular status: blood pressure returned to baseline and stable Postop Assessment: no apparent nausea or vomiting Anesthetic complications: no   There were no known notable events for this encounter.   Last Vitals:  Vitals:   04/09/23 0821 04/09/23 0830  BP: 125/75 132/82  Pulse: 89 76  Temp:    SpO2: 99% 99%    Last Pain:  Vitals:   04/10/23 0741  TempSrc:   PainSc: 0-No pain                 Lenard Simmer

## 2023-04-15 ENCOUNTER — Encounter: Payer: Self-pay | Admitting: Gastroenterology

## 2023-04-15 NOTE — Progress Notes (Signed)
7 years 

## 2023-04-29 ENCOUNTER — Ambulatory Visit (INDEPENDENT_AMBULATORY_CARE_PROVIDER_SITE_OTHER): Payer: HMO | Admitting: Family Medicine

## 2023-04-29 VITALS — BP 128/80 | HR 90 | Temp 98.4°F | Ht 70.0 in | Wt 173.0 lb

## 2023-04-29 DIAGNOSIS — J0110 Acute frontal sinusitis, unspecified: Secondary | ICD-10-CM | POA: Diagnosis not present

## 2023-04-29 MED ORDER — AMOXICILLIN-POT CLAVULANATE 875-125 MG PO TABS: 1.0000 | ORAL_TABLET | Freq: Two times a day (BID) | ORAL | 0 refills | Status: DC

## 2023-04-29 NOTE — Progress Notes (Signed)
 Date:  04/29/2023   Name:  Jordan Morton   DOB:  Jan 05, 1956   MRN:  [id number]   Chief Complaint: Nasal Congestion and Cough (Pt states symptoms started a week ago 04/27/2022. Pt states he has been taking tylenol due to bad headache. He took tylenol 2hrs ago. He started on coricidin and switched to mucinex . Pt states mucus is green.)  Cough This is a chronic problem. The current episode started more than 1 year ago. The problem has been gradually improving. The cough is Productive of blood-tinged sputum. Associated symptoms include chills, headaches, nasal congestion, postnasal drip and rhinorrhea. Pertinent negatives include no chest pain, ear congestion, ear pain, fever, hemoptysis, sore throat, shortness of breath or wheezing. The symptoms are aggravated by lying down. He has tried OTC cough suppressant and OTC inhaler for the symptoms.    Lab Results  Component Value Date   NA 134 08/07/2022   K 5.0 08/07/2022   CO2 24 08/07/2022   GLUCOSE 87 08/07/2022   BUN 8 08/07/2022   CREATININE 0.78 08/07/2022   CALCIUM 9.7 08/07/2022   EGFR 99 08/07/2022   GFRNONAA 94 04/28/2019   Lab Results  Component Value Date   CHOL 159 02/05/2022   HDL 66 02/05/2022   LDLCALC 81 02/05/2022   TRIG 58 02/05/2022   CHOLHDL 3.1 04/02/2018   Lab Results  Component Value Date   TSH 1.070 05/29/2021   No results found for: HGBA1C Lab Results  Component Value Date   WBC 9.8 02/05/2022   HGB 16.1 02/05/2022   HCT 45.5 02/05/2022   MCV 95 02/05/2022   PLT 436 02/05/2022   Lab Results  Component Value Date   ALT 21 08/07/2022   AST 18 08/07/2022   ALKPHOS 49 08/07/2022   BILITOT 0.5 08/07/2022   No results found for: 25OHVITD2, 25OHVITD3, VD25OH   Review of Systems  Constitutional:  Positive for chills. Negative for fever.  HENT:  Positive for postnasal drip, rhinorrhea, sinus pressure and sinus pain. Negative for ear pain, nosebleeds and sore throat.   Respiratory:  Positive  for cough. Negative for hemoptysis, chest tightness, shortness of breath and wheezing.   Cardiovascular:  Positive for palpitations. Negative for chest pain and leg swelling.  Gastrointestinal:  Negative for abdominal pain.  Neurological:  Positive for headaches.    Patient Active Problem List   Diagnosis Date Noted   History of colonic polyps 04/09/2023   Adenomatous polyp of colon 04/09/2023   Shoulder impingement 05/07/2022   Erectile dysfunction 07/14/2021   Essential hypertension 05/22/2016   Depression, major, single episode, mild (HCC) 05/22/2016   Hyperlipidemia 05/22/2016   Nocturia 05/22/2016    No Known Allergies  Past Surgical History:  Procedure Laterality Date   BIOPSY  04/09/2023   Procedure: BIOPSY;  Surgeon: Therisa Bi, MD;  Location: [facility] ENDOSCOPY;  Service: Gastroenterology;;   COLONOSCOPY  2014   repeat in 5 years   COLONOSCOPY WITH PROPOFOL  N/A 04/08/2018   Procedure: COLONOSCOPY WITH PROPOFOL ;  Surgeon: Therisa Bi, MD;  Location: [facility] ENDOSCOPY;  Service: Gastroenterology;  Laterality: N/A;   COLONOSCOPY WITH PROPOFOL  N/A 04/09/2023   Procedure: COLONOSCOPY WITH PROPOFOL ;  Surgeon: Therisa Bi, MD;  Location: [facility] ENDOSCOPY;  Service: Gastroenterology;  Laterality: N/A;   TONSILLECTOMY      Social History   Tobacco Use   Smoking status: Every Day    Current packs/day: 1.00    Average packs/day: 1 pack/day for 45.0 years (45.0 ttl pk-yrs)  Types: Cigarettes   Smokeless tobacco: Current    Types: Snuff  Vaping Use   Vaping status: Never Used  Substance Use Topics   Alcohol use: Yes    Alcohol/week: 56.0 standard drinks of alcohol    Types: 28 Cans of beer, 28 Standard drinks or equivalent per week   Drug use: No     Medication list has been reviewed and updated.  Current Meds  Medication Sig   aspirin  EC 81 MG tablet Take 1 tablet (81 mg total) by mouth daily.   cyclobenzaprine  (FLEXERIL ) 10 MG tablet Take 1 tablet (10 mg total) by  mouth at bedtime as needed for muscle spasms.   lisinopril -hydrochlorothiazide  (ZESTORETIC ) 10-12.5 MG tablet Take 1 tablet by mouth daily.   Multiple Vitamins-Minerals (MULTIVITAMIN ADULTS 50+ PO) Take 1 each by mouth daily.   sertraline  (ZOLOFT ) 50 MG tablet Take 1 tablet (50 mg total) by mouth daily.   sildenafil  (VIAGRA ) 100 MG tablet Take 1 tablet (100 mg total) by mouth daily as needed for erectile dysfunction.       02/07/2023    8:07 AM 08/07/2022    8:08 AM 04/24/2022    4:35 PM 02/05/2022    8:07 AM  GAD 7 : Generalized Anxiety Score  Nervous, Anxious, on Edge 0 0 0 0  Control/stop worrying 0 0 0 0  Worry too much - different things 0 0 0 0  Trouble relaxing 0 0 0 0  Restless 0 0 0 0  Easily annoyed or irritable 0 0 0 0  Afraid - awful might happen 0 0 0 0  Total GAD 7 Score 0 0 0 0  Anxiety Difficulty Not difficult at all Not difficult at all Not difficult at all Not difficult at all       02/07/2023    8:06 AM 08/07/2022    8:08 AM 04/24/2022    4:35 PM  Depression screen PHQ 2/9  Decreased Interest 0 0 0  Down, Depressed, Hopeless 0 0 0  PHQ - 2 Score 0 0 0  Altered sleeping 0 0 0  Tired, decreased energy 0 0 0  Change in appetite 0 0 0  Feeling bad or failure about yourself  0 0 0  Trouble concentrating 0 0 0  Moving slowly or fidgety/restless 0 0 0  Suicidal thoughts 0 0 0  PHQ-9 Score 0 0 0  Difficult doing work/chores Not difficult at all Not difficult at all Not difficult at all    BP Readings from Last 3 Encounters:  04/29/23 128/80  04/09/23 132/82  02/07/23 122/64    Physical Exam Vitals and nursing note reviewed.  HENT:     Head: Normocephalic.     Right Ear: Tympanic membrane, ear canal and external ear normal.     Left Ear: Tympanic membrane, ear canal and external ear normal.     Nose:     Right Sinus: Maxillary sinus tenderness present. No frontal sinus tenderness.     Left Sinus: Maxillary sinus tenderness present. No frontal sinus  tenderness.     Mouth/Throat:     Mouth: Mucous membranes are moist.  Eyes:     General: No scleral icterus.       Right eye: No discharge.        Left eye: No discharge.     Conjunctiva/sclera: Conjunctivae normal.     Pupils: Pupils are equal, round, and reactive to light.  Neck:     Thyroid : No thyromegaly.  Vascular: No JVD.     Trachea: No tracheal deviation.  Cardiovascular:     Rate and Rhythm: Normal rate and regular rhythm.     Heart sounds: Normal heart sounds. No murmur heard.    No friction rub. No gallop.  Pulmonary:     Effort: No respiratory distress.     Breath sounds: Normal breath sounds. No wheezing, rhonchi or rales.  Abdominal:     General: Bowel sounds are normal.     Palpations: Abdomen is soft. There is no mass.     Tenderness: There is no abdominal tenderness. There is no guarding or rebound.  Musculoskeletal:        General: No tenderness. Normal range of motion.     Cervical back: Normal range of motion and neck supple.  Lymphadenopathy:     Cervical: No cervical adenopathy.  Skin:    General: Skin is warm.     Findings: No rash.  Neurological:     Mental Status: He is alert and oriented to person, place, and time.     Cranial Nerves: No cranial nerve deficit.     Deep Tendon Reflexes: Reflexes are normal and symmetric.     Wt Readings from Last 3 Encounters:  04/29/23 173 lb (78.5 kg)  04/09/23 165 lb (74.8 kg)  02/07/23 165 lb (74.8 kg)    BP 128/80   Pulse 90   Temp 98.4 F (36.9 C) (Oral)   Ht 5' 10 (1.778 m)   Wt 173 lb (78.5 kg)   SpO2 98%   BMI 24.82 kg/m   Assessment and Plan:  1. Acute non-recurrent frontal sinusitis (Primary) Acute.  Persistent.  Stable.  Upper respiratory infection now involving the lower respiratory area.  This is consistent with exam and history for sinus infection we will treat with Augmentin  875 mg twice a day for 10 days. - amoxicillin -clavulanate (AUGMENTIN ) 875-125 MG tablet; Take 1 tablet by  mouth 2 (two) times daily.  Dispense: 20 tablet; Refill: 0    Cathryne Molt, MD

## 2023-05-01 ENCOUNTER — Ambulatory Visit (INDEPENDENT_AMBULATORY_CARE_PROVIDER_SITE_OTHER): Payer: HMO

## 2023-05-01 DIAGNOSIS — Z0000 Encounter for general adult medical examination without abnormal findings: Secondary | ICD-10-CM

## 2023-05-01 NOTE — Patient Instructions (Addendum)
 Mr. Jordan Morton , Thank you for taking time to come for your Medicare Wellness Visit. I appreciate your ongoing commitment to your health goals. Please review the following plan we discussed and let me know if I can assist you in the future.   Referrals/Orders/Follow-Ups/Clinician Recommendations: NONE  This is a list of the screening recommended for you and due dates:  Health Maintenance  Topic Date Due   Pneumonia Vaccine (1 of 2 - PCV) Never done   Hepatitis C Screening  Never done   Screening for Lung Cancer  10/28/2021   Medicare Annual Wellness Visit  04/30/2024   Colon Cancer Screening  04/08/2028   DTaP/Tdap/Td vaccine (2 - Td or Tdap) 04/27/2029   Flu Shot  Completed   Zoster (Shingles) Vaccine  Completed   HPV Vaccine  Aged Out   COVID-19 Vaccine  Discontinued    Advanced directives: (ACP Link)Information on Advanced Care Planning can be found at [place]  Secretary of [facility] Advance Health Care Directives Advance Health Care Directives ([email or web address]   Next Medicare Annual Wellness Visit scheduled for next year: Yes   05/06/24 @ 8:50 AM BY VIDEO

## 2023-05-01 NOTE — Progress Notes (Signed)
 Subjective:   Jordan Morton is a 68 y.o. male who presents for an Initial Medicare Annual Wellness Visit.  Visit Complete: Virtual I connected with  Jordan Morton on 05/01/23 by a audio enabled telemedicine application and verified that I am speaking with the correct person using two identifiers.  This patient declined Interactive audio and acupuncturist. Therefore the visit was completed with audio only.   Patient Location: Home  Provider Location: Office/Clinic  I discussed the limitations of evaluation and management by telemedicine. The patient expressed understanding and agreed to proceed.  Vital Signs: Because this visit was a virtual/telehealth visit, some criteria may be missing or patient reported. Any vitals not documented were not able to be obtained and vitals that have been documented are patient reported.  Cardiac Risk Factors include: advanced age (>71men, >61 women);dyslipidemia;male gender;hypertension     Objective:    There were no vitals filed for this visit. There is no height or weight on file to calculate BMI.     05/01/2023    8:56 AM 04/09/2023    7:05 AM 04/08/2018    8:36 AM  Advanced Directives  Does Patient Have a Medical Advance Directive? No Yes No  Type of Special Educational Needs Teacher of [place];Living will   Copy of Healthcare Power of Attorney in Chart?  Yes - validated most recent copy scanned in chart (See row information)   Would patient like information on creating a medical advance directive? No - Patient declined      Current Medications (verified) Outpatient Encounter Medications as of 05/01/2023  Medication Sig   amoxicillin -clavulanate (AUGMENTIN ) 875-125 MG tablet Take 1 tablet by mouth 2 (two) times daily.   aspirin  EC 81 MG tablet Take 1 tablet (81 mg total) by mouth daily.   cyclobenzaprine  (FLEXERIL ) 10 MG tablet Take 1 tablet (10 mg total) by mouth at bedtime as needed for muscle spasms.    lisinopril -hydrochlorothiazide  (ZESTORETIC ) 10-12.5 MG tablet Take 1 tablet by mouth daily.   Multiple Vitamins-Minerals (MULTIVITAMIN ADULTS 50+ PO) Take 1 each by mouth daily.   sertraline  (ZOLOFT ) 50 MG tablet Take 1 tablet (50 mg total) by mouth daily.   sildenafil  (VIAGRA ) 100 MG tablet Take 1 tablet (100 mg total) by mouth daily as needed for erectile dysfunction.   meloxicam  (MOBIC ) 15 MG tablet TAKE 1 TABLET BY MOUTH EVERY DAY AS NEEDED FOR PAIN   pantoprazole  (PROTONIX ) 40 MG tablet Take 1 tablet (40 mg total) by mouth daily.   No facility-administered encounter medications on file as of 05/01/2023.    Allergies (verified) Patient has no known allergies.   History: Past Medical History:  Diagnosis Date   Depression    Hypertension    Past Surgical History:  Procedure Laterality Date   BIOPSY  04/09/2023   Procedure: BIOPSY;  Surgeon: Therisa Bi, MD;  Location: [facility] ENDOSCOPY;  Service: Gastroenterology;;   COLONOSCOPY  2014   repeat in 5 years   COLONOSCOPY WITH PROPOFOL  N/A 04/08/2018   Procedure: COLONOSCOPY WITH PROPOFOL ;  Surgeon: Therisa Bi, MD;  Location: [facility] ENDOSCOPY;  Service: Gastroenterology;  Laterality: N/A;   COLONOSCOPY WITH PROPOFOL  N/A 04/09/2023   Procedure: COLONOSCOPY WITH PROPOFOL ;  Surgeon: Therisa Bi, MD;  Location: [facility] ENDOSCOPY;  Service: Gastroenterology;  Laterality: N/A;   TONSILLECTOMY     Family History  Problem Relation Age of Onset   Cancer Mother    Cancer Father    Social History   Socioeconomic History   Marital  status: Widowed    Spouse name: Not on file   Number of children: Not on file   Years of education: Not on file   Highest education level: 12th grade  Occupational History   Not on file  Tobacco Use   Smoking status: Every Day    Current packs/day: 1.00    Average packs/day: 1 pack/day for 45.0 years (45.0 ttl pk-yrs)    Types: Cigarettes   Smokeless tobacco: Current    Types: Snuff  Vaping Use   Vaping  status: Never Used  Substance and Sexual Activity   Alcohol use: Yes    Alcohol/week: 56.0 standard drinks of alcohol    Types: 28 Cans of beer, 28 Standard drinks or equivalent per week   Drug use: No   Sexual activity: Yes  Other Topics Concern   Not on file  Social History Narrative   Not on file   Social Drivers of Health   Financial Resource Strain: Low Risk  (05/01/2023)   Overall Financial Resource Strain (CARDIA)    Difficulty of Paying Living Expenses: Not hard at all  Food Insecurity: No Food Insecurity (05/01/2023)   Hunger Vital Sign    Worried About Running Out of Food in the Last Year: Never true    Ran Out of Food in the Last Year: Never true  Transportation Needs: No Transportation Needs (05/01/2023)   PRAPARE - Administrator, Civil Service (Medical): No    Lack of Transportation (Non-Medical): No  Physical Activity: Insufficiently Active (05/01/2023)   Exercise Vital Sign    Days of Exercise per Week: 7 days    Minutes of Exercise per Session: 20 min  Stress: No Stress Concern Present (05/01/2023)   [employer] of Occupational Health - Occupational Stress Questionnaire    Feeling of Stress : Not at all  Social Connections: Moderately Integrated (05/01/2023)   Social Connection and Isolation Panel [NHANES]    Frequency of Communication with Friends and Family: More than three times a week    Frequency of Social Gatherings with Friends and Family: Twice a week    Attends Religious Services: More than 4 times per year    Active Member of [employer] or Organizations: Yes    Attends Banker Meetings: More than 4 times per year    Marital Status: Widowed  Recent Concern: Social Connections - Socially Isolated (04/29/2023)   Social Connection and Isolation Panel [NHANES]    Frequency of Communication with Friends and Family: Once a week    Frequency of Social Gatherings with Friends and Family: Once a week    Attends Religious Services: More than 4  times per year    Active Member of [employer] or Organizations: No    Attends Banker Meetings: Not on file    Marital Status: Widowed    Tobacco Counseling Ready to quit: Not Answered Counseling given: Not Answered   Clinical Intake:  Pre-visit preparation completed: Yes  Pain : No/denies pain     BMI - recorded: 24.8 Nutritional Status: BMI of 19-24  Normal Nutritional Risks: None Diabetes: No     Interpreter Needed?: No  Information entered by :: JHONNIE DAS, LPN   Activities of Daily Living    05/01/2023    8:57 AM 04/26/2023   10:06 AM  In your present state of health, do you have any difficulty performing the following activities:  Hearing? 0 0  Vision? 0 0  Difficulty concentrating or making decisions?  0 0  Walking or climbing stairs? 0 0  Dressing or bathing? 0 0  Doing errands, shopping? 0 0  Preparing Food and eating ? N N  Using the Toilet? N N  In the past six months, have you accidently leaked urine? N N  Do you have problems with loss of bowel control? N N  Managing your Medications? N N  Managing your Finances? N N  Housekeeping or managing your Housekeeping? N N    Patient Care Team: Joshua Cathryne BROCKS, MD as PCP - General (Family Medicine) Mevelyn JONETTA Bathe, OD (Optometry)  Indicate any recent Medical Services you may have received from other than [facility] providers in the past year (date may be approximate).     Assessment:   This is a routine wellness examination for [facility].  Hearing/Vision screen Hearing Screening - Comments:: NO AIDS Vision Screening - Comments:: WEARS GLASSES ALL THE TIME- [facility]   Goals Addressed             This Visit's Progress    DIET - EAT MORE FRUITS AND VEGETABLES         Depression Screen    05/01/2023    8:54 AM 02/07/2023    8:06 AM 08/07/2022    8:08 AM 04/24/2022    4:35 PM 02/05/2022    8:07 AM 08/04/2021    8:05 AM 05/29/2021    8:43 AM  PHQ 2/9 Scores  PHQ - 2 Score 0 0 0 0 0 0 0   PHQ- 9 Score 0 0 0 0 0 0 3    Fall Risk    05/01/2023    8:57 AM 04/26/2023   10:06 AM 02/07/2023    8:06 AM 08/07/2022    8:07 AM 04/24/2022    4:35 PM  Fall Risk   Falls in the past year? 0 0 0 0 0  Number falls in past yr: 0  0 0 0  Injury with Fall? 0  0 0 0  Risk for fall due to : No Fall Risks  No Fall Risks No Fall Risks No Fall Risks  Follow up Falls prevention discussed;Falls evaluation completed  Falls evaluation completed Falls evaluation completed Falls evaluation completed    MEDICARE RISK AT HOME: Medicare Risk at Home Any stairs in or around the home?: Yes If so, are there any without handrails?: Yes Home free of loose throw rugs in walkways, pet beds, electrical cords, etc?: Yes Adequate lighting in your home to reduce risk of falls?: Yes Life alert?: No Use of a cane, walker or w/c?: No Grab bars in the bathroom?: Yes Shower chair or bench in shower?: No Elevated toilet seat or a handicapped toilet?: Yes  TIMED UP AND GO:  Was the test performed? No    Cognitive Function:        05/01/2023    8:58 AM  6CIT Screen  What Year? 0 points  What month? 0 points  What time? 0 points  Count back from 20 0 points  Months in reverse 0 points  Repeat phrase 0 points  Total Score 0 points    Immunizations Immunization History  Administered Date(s) Administered   Fluad Trivalent(High Dose 65+) 02/07/2023   Influenza,inj,Quad PF,6+ Mos 02/23/2020, 02/02/2021, 02/05/2022   Influenza-Unspecified 01/22/2019   PFIZER(Purple Top)SARS-COV-2 Vaccination 07/24/2019, 08/14/2019   Tdap 04/28/2019   Zoster Recombinant(Shingrix) 08/04/2021, 10/23/2021    TDAP status: Up to date  Flu Vaccine status: Up to date  Pneumococcal vaccine status: Declined,  Education has been provided regarding the importance of this vaccine but patient still declined. Advised may receive this vaccine at local pharmacy or Health Dept. Aware to provide a copy of the vaccination record if  obtained from local pharmacy or Health Dept. Verbalized acceptance and understanding.   Covid-19 vaccine status: Completed vaccines  Qualifies for Shingles Vaccine? Yes   Zostavax completed No   Shingrix Completed?: Yes  Screening Tests Health Maintenance  Topic Date Due   Pneumonia Vaccine 48+ Years old (1 of 2 - PCV) Never done   Hepatitis C Screening  Never done   Lung Cancer Screening  10/28/2021   Medicare Annual Wellness (AWV)  04/30/2024   Colonoscopy  04/08/2028   DTaP/Tdap/Td (2 - Td or Tdap) 04/27/2029   INFLUENZA VACCINE  Completed   Zoster Vaccines- Shingrix  Completed   HPV VACCINES  Aged Out   COVID-19 Vaccine  Discontinued    Health Maintenance  Health Maintenance Due  Topic Date Due   Pneumonia Vaccine 23+ Years old (1 of 2 - PCV) Never done   Hepatitis C Screening  Never done   Lung Cancer Screening  10/28/2021    Colorectal cancer screening: Type of screening: Colonoscopy. Completed 04/09/23. Repeat every 5 years  Lung Cancer Screening: (Low Dose CT Chest recommended if Age 26-80 years, 20 pack-year currently smoking OR have quit w/in 15years.) does qualify.   Lung Cancer Screening Referral: CT SCAN DONE 10/28/20- DECLINED REFERRAL D/T CO[facility]  Additional Screening:  Hepatitis C Screening: does qualify; Completed NO  Vision Screening: Recommended annual ophthalmology exams for early detection of glaucoma and other disorders of the eye. Is the patient up to date with their annual eye exam?  Yes  Who is the provider or what is the name of the office in which the patient attends annual eye exams? [facility] If pt is not established with a provider, would they like to be referred to a provider to establish care? No .   Dental Screening: Recommended annual dental exams for proper oral hygiene   Community Resource Referral / Chronic Care Management: CRR required this visit?  No   CCM required this visit?  No    Plan:     I have personally reviewed and  noted the following in the patient's chart:   Medical and social history Use of alcohol, tobacco or illicit drugs  Current medications and supplements including opioid prescriptions. Patient is not currently taking opioid prescriptions. Functional ability and status Nutritional status Physical activity Advanced directives List of other physicians Hospitalizations, surgeries, and ER visits in previous 12 months Vitals Screenings to include cognitive, depression, and falls Referrals and appointments  In addition, I have reviewed and discussed with patient certain preventive protocols, quality metrics, and best practice recommendations. A written personalized care plan for preventive services as well as general preventive health recommendations were provided to patient.     Jhonnie GORMAN Das, LPN   11/21/7972   After Visit Summary: (MyChart) Due to this being a telephonic visit, the after visit summary with patients personalized plan was offered to patient via MyChart   Nurse Notes: NONE

## 2023-06-26 DIAGNOSIS — G4733 Obstructive sleep apnea (adult) (pediatric): Secondary | ICD-10-CM | POA: Diagnosis not present

## 2023-06-26 DIAGNOSIS — R0683 Snoring: Secondary | ICD-10-CM | POA: Diagnosis not present

## 2023-06-27 DIAGNOSIS — F17210 Nicotine dependence, cigarettes, uncomplicated: Secondary | ICD-10-CM | POA: Diagnosis not present

## 2023-06-27 DIAGNOSIS — I10 Essential (primary) hypertension: Secondary | ICD-10-CM | POA: Diagnosis not present

## 2023-06-27 DIAGNOSIS — N529 Male erectile dysfunction, unspecified: Secondary | ICD-10-CM | POA: Diagnosis not present

## 2023-06-27 DIAGNOSIS — F419 Anxiety disorder, unspecified: Secondary | ICD-10-CM | POA: Diagnosis not present

## 2023-06-27 DIAGNOSIS — Z7982 Long term (current) use of aspirin: Secondary | ICD-10-CM | POA: Diagnosis not present

## 2023-06-27 DIAGNOSIS — G4733 Obstructive sleep apnea (adult) (pediatric): Secondary | ICD-10-CM | POA: Diagnosis not present

## 2023-08-08 ENCOUNTER — Other Ambulatory Visit: Payer: Self-pay

## 2023-08-08 ENCOUNTER — Encounter: Payer: Self-pay | Admitting: Family Medicine

## 2023-08-08 ENCOUNTER — Ambulatory Visit (INDEPENDENT_AMBULATORY_CARE_PROVIDER_SITE_OTHER): Payer: Self-pay | Admitting: Family Medicine

## 2023-08-08 VITALS — BP 132/78 | HR 82 | Ht 70.0 in | Wt 168.0 lb

## 2023-08-08 DIAGNOSIS — F17210 Nicotine dependence, cigarettes, uncomplicated: Secondary | ICD-10-CM

## 2023-08-08 DIAGNOSIS — R7989 Other specified abnormal findings of blood chemistry: Secondary | ICD-10-CM

## 2023-08-08 DIAGNOSIS — N529 Male erectile dysfunction, unspecified: Secondary | ICD-10-CM

## 2023-08-08 DIAGNOSIS — Z23 Encounter for immunization: Secondary | ICD-10-CM | POA: Diagnosis not present

## 2023-08-08 DIAGNOSIS — Z122 Encounter for screening for malignant neoplasm of respiratory organs: Secondary | ICD-10-CM | POA: Diagnosis not present

## 2023-08-08 DIAGNOSIS — R351 Nocturia: Secondary | ICD-10-CM

## 2023-08-08 DIAGNOSIS — F331 Major depressive disorder, recurrent, moderate: Secondary | ICD-10-CM

## 2023-08-08 DIAGNOSIS — R5383 Other fatigue: Secondary | ICD-10-CM

## 2023-08-08 DIAGNOSIS — I10 Essential (primary) hypertension: Secondary | ICD-10-CM | POA: Diagnosis not present

## 2023-08-08 DIAGNOSIS — E7801 Familial hypercholesterolemia: Secondary | ICD-10-CM

## 2023-08-08 DIAGNOSIS — Z87891 Personal history of nicotine dependence: Secondary | ICD-10-CM

## 2023-08-08 MED ORDER — SILDENAFIL CITRATE 100 MG PO TABS: 100.0000 mg | ORAL_TABLET | Freq: Every day | ORAL | 11 refills | Status: AC | PRN

## 2023-08-08 MED ORDER — SERTRALINE HCL 50 MG PO TABS: 50.0000 mg | ORAL_TABLET | Freq: Every day | ORAL | 1 refills | Status: AC

## 2023-08-08 MED ORDER — LISINOPRIL-HYDROCHLOROTHIAZIDE 10-12.5 MG PO TABS: 1.0000 | ORAL_TABLET | Freq: Every day | ORAL | 1 refills | Status: AC

## 2023-08-08 NOTE — Patient Instructions (Signed)

## 2023-08-08 NOTE — Progress Notes (Signed)
 Date:  08/08/2023   Name:  Jordan Morton   DOB:  1955/06/29   MRN:  [id number]   Chief Complaint: Hypertension, Depression, and Erectile Dysfunction  Hypertension This is a chronic problem. The current episode started more than 1 year ago. The problem has been gradually improving since onset. The problem is controlled. Associated symptoms include malaise/fatigue. Pertinent negatives include no blurred vision, chest pain, orthopnea, palpitations, PND or shortness of breath. There are no associated agents to hypertension. There are no known risk factors for coronary artery disease. Past treatments include ACE inhibitors and diuretics. The current treatment provides moderate improvement. There are no compliance problems.  There is no history of CAD/MI or CVA. There is no history of chronic renal disease, hyperaldosteronism or a hypertension causing med.  Depression        This is a chronic problem.  The current episode started more than 1 year ago.   The onset quality is gradual.   The problem occurs rarely.  The problem has been gradually improving since onset.  Associated symptoms include fatigue.  Associated symptoms include no helplessness, no hopelessness, does not have insomnia, no restlessness, no decreased interest, no appetite change, no body aches, not sad and no suicidal ideas.  Past treatments include SSRIs - Selective serotonin reuptake inhibitors.  Previous treatment provided moderate relief. Erectile Dysfunction This is a chronic problem. The current episode started more than 1 year ago. The problem has been gradually improving since onset. The nature of his difficulty is achieving erection and maintaining erection. Obstructive symptoms do not include dribbling, incomplete emptying, an intermittent stream, a slower stream, straining or a weak stream. Pertinent negatives include no dysuria. Past treatments include sildenafil. The treatment provided moderate relief.    Lab Results   Component Value Date   NA 134 08/07/2022   K 5.0 08/07/2022   CO2 24 08/07/2022   GLUCOSE 87 08/07/2022   BUN 8 08/07/2022   CREATININE 0.78 08/07/2022   CALCIUM 9.7 08/07/2022   EGFR 99 08/07/2022   GFRNONAA 94 04/28/2019   Lab Results  Component Value Date   CHOL 159 02/05/2022   HDL 66 02/05/2022   LDLCALC 81 02/05/2022   TRIG 58 02/05/2022   CHOLHDL 3.1 04/02/2018   Lab Results  Component Value Date   TSH 1.070 05/29/2021   No results found for: "HGBA1C" Lab Results  Component Value Date   WBC 9.8 02/05/2022   HGB 16.1 02/05/2022   HCT 45.5 02/05/2022   MCV 95 02/05/2022   PLT 436 02/05/2022   Lab Results  Component Value Date   ALT 21 08/07/2022   AST 18 08/07/2022   ALKPHOS 49 08/07/2022   BILITOT 0.5 08/07/2022   No results found for: "25OHVITD2", "25OHVITD3", "VD25OH"   Review of Systems  Constitutional:  Positive for fatigue and malaise/fatigue. Negative for appetite change.  Eyes:  Negative for blurred vision and visual disturbance.  Respiratory:  Negative for cough, choking, shortness of breath, wheezing and stridor.   Cardiovascular:  Negative for chest pain, palpitations, orthopnea and PND.  Gastrointestinal:  Negative for blood in stool.  Genitourinary:  Negative for decreased urine volume, difficulty urinating, dysuria and incomplete emptying.  Psychiatric/Behavioral:  Positive for depression. Negative for suicidal ideas. The patient does not have insomnia.     Patient Active Problem List   Diagnosis Date Noted   History of colonic polyps 04/09/2023   Adenomatous polyp of colon 04/09/2023   Shoulder impingement 05/07/2022  Erectile dysfunction 07/14/2021   Essential hypertension 05/22/2016   Depression, major, single episode, mild (HCC) 05/22/2016   Hyperlipidemia 05/22/2016   Nocturia 05/22/2016    No Known Allergies  Past Surgical History:  Procedure Laterality Date   BIOPSY  04/09/2023   Procedure: BIOPSY;  Surgeon: Luke Salaam,  MD;  Location: [facility] ENDOSCOPY;  Service: Gastroenterology;;   COLONOSCOPY  2014   repeat in 5 years   COLONOSCOPY WITH PROPOFOL N/A 04/08/2018   Procedure: COLONOSCOPY WITH PROPOFOL;  Surgeon: Luke Salaam, MD;  Location: [facility] ENDOSCOPY;  Service: Gastroenterology;  Laterality: N/A;   COLONOSCOPY WITH PROPOFOL N/A 04/09/2023   Procedure: COLONOSCOPY WITH PROPOFOL;  Surgeon: Luke Salaam, MD;  Location[facility] ENDOSCOPY;  Service: Gastroenterology;  Laterality: N/A;   TONSILLECTOMY      Social History   Tobacco Use   Smoking status: Every Day    Current packs/day: 1.00    Average packs/day: 1 pack/day for 45.0 years (45.0 ttl pk-yrs)    Types: Cigarettes   Smokeless tobacco: Current    Types: Snuff  Vaping Use   Vaping status: Never Used  Substance Use Topics   Alcohol use: Yes    Alcohol/week: 56.0 standard drinks of alcohol    Types: 28 Cans of beer, 28 Standard drinks or equivalent per week   Drug use: No     Medication list has been reviewed and updated.  Current Meds  Medication Sig   aspirin EC 81 MG tablet Take 1 tablet (81 mg total) by mouth daily.   cyclobenzaprine (FLEXERIL) 10 MG tablet Take 1 tablet (10 mg total) by mouth at bedtime as needed for muscle spasms.   lisinopril-hydrochlorothiazide (ZESTORETIC) 10-12.5 MG tablet Take 1 tablet by mouth daily.   Multiple Vitamins-Minerals (MULTIVITAMIN ADULTS 50+ PO) Take 1 each by mouth daily.   sertraline (ZOLOFT) 50 MG tablet Take 1 tablet (50 mg total) by mouth daily.   sildenafil (VIAGRA) 100 MG tablet Take 1 tablet (100 mg total) by mouth daily as needed for erectile dysfunction.       08/08/2023    8:12 AM 02/07/2023    8:07 AM 08/07/2022    8:08 AM 04/24/2022    4:35 PM  GAD 7 : Generalized Anxiety Score  Nervous, Anxious, on Edge 0 0 0 0  Control/stop worrying 0 0 0 0  Worry too much - different things 0 0 0 0  Trouble relaxing 0 0 0 0  Restless 0 0 0 0  Easily annoyed or irritable 0 0 0 0  Afraid - awful  might happen 0 0 0 0  Total GAD 7 Score 0 0 0 0  Anxiety Difficulty Not difficult at all Not difficult at all Not difficult at all Not difficult at all       08/08/2023    8:12 AM 05/01/2023    8:54 AM 02/07/2023    8:06 AM  Depression screen PHQ 2/9  Decreased Interest 0 0 0  Down, Depressed, Hopeless 0 0 0  PHQ - 2 Score 0 0 0  Altered sleeping 0 0 0  Tired, decreased energy 1 0 0  Change in appetite 0 0 0  Feeling bad or failure about yourself  0 0 0  Trouble concentrating 0 0 0  Moving slowly or fidgety/restless 0 0 0  Suicidal thoughts 0 0 0  PHQ-9 Score 1 0 0  Difficult doing work/chores Not difficult at all Not difficult at all Not difficult at all    BP  Readings from Last 3 Encounters:  08/08/23 132/78  04/29/23 128/80  04/09/23 132/82    Physical Exam Vitals and nursing note reviewed.  Constitutional:      Appearance: He is well-developed.  HENT:     Head: Normocephalic and atraumatic.     Right Ear: Tympanic membrane, ear canal and external ear normal.     Left Ear: Tympanic membrane, ear canal and external ear normal.     Nose: Nose normal.     Mouth/Throat:     Dentition: Normal dentition.  Eyes:     General: Lids are normal. No scleral icterus.    Conjunctiva/sclera: Conjunctivae normal.     Pupils: Pupils are equal, round, and reactive to light.  Neck:     Thyroid: No thyromegaly.     Vascular: No carotid bruit, hepatojugular reflux or JVD.     Trachea: No tracheal deviation.  Cardiovascular:     Rate and Rhythm: Normal rate and regular rhythm.     Chest Wall: PMI is not displaced.     Heart sounds: Normal heart sounds, S1 normal and S2 normal.     No systolic murmur is present.     No diastolic murmur is present.     No gallop. No S3 or S4 sounds.  Pulmonary:     Effort: Pulmonary effort is normal.     Breath sounds: Normal breath sounds. No wheezing, rhonchi or rales.  Abdominal:     General: Bowel sounds are normal.     Palpations: Abdomen  is soft. There is no hepatomegaly, splenomegaly or mass.     Tenderness: There is no abdominal tenderness.     Hernia: There is no hernia in the left inguinal area.  Genitourinary:    Prostate: Normal. Not enlarged, not tender and no nodules present.     Rectum: Normal. Guaiac result negative. No mass.  Musculoskeletal:        General: Normal range of motion.     Cervical back: Normal range of motion and neck supple.  Lymphadenopathy:     Cervical: No cervical adenopathy.  Skin:    General: Skin is warm and dry.     Findings: No rash.  Neurological:     Mental Status: He is alert and oriented to person, place, and time.     Sensory: No sensory deficit.     Deep Tendon Reflexes: Reflexes are normal and symmetric.  Psychiatric:        Mood and Affect: Mood is not anxious or depressed.     Wt Readings from Last 3 Encounters:  08/08/23 168 lb (76.2 kg)  04/29/23 173 lb (78.5 kg)  04/09/23 165 lb (74.8 kg)    BP 132/78   Pulse 82   Ht 5\' 10"  (1.778 m)   Wt 168 lb (76.2 kg)   SpO2 97%   BMI 24.11 kg/m   Assessment and Plan:  1. Essential hypertension (Primary) Chronic.  Controlled.  Stable.  Asymptomatic.  Blood pressure today is 132/78.  Tolerating medication well.  Will continue lisinopril hydrochlorothiazide 10-12.5 mg once a day.  Will check CMP for electrolytes and GFR.  Patient has been encouraged to stop smoking. - lisinopril-hydrochlorothiazide (ZESTORETIC) 10-12.5 MG tablet; Take 1 tablet by mouth daily.  Dispense: 90 tablet; Refill: 1 - Comprehensive metabolic panel with GFR  2. Moderate episode of recurrent major depressive disorder (HCC) Chronic.  Controlled.  Stable.  PHQ is 1 GAD score is 0.  Asymptomatic otherwise with no despondency lack  of interest doing things and self harm concerns are negative.  Continue sertraline 50 mg once a day.  Will recheck in 6 months. - sertraline (ZOLOFT) 50 MG tablet; Take 1 tablet (50 mg total) by mouth daily.  Dispense: 90  tablet; Refill: 1  3. Erectile dysfunction, unspecified erectile dysfunction type Chronic.  Stable.  Patient has results on current dosing of sildenafil 100 mg as needed.  We will continue at current dosing. - sildenafil (VIAGRA) 100 MG tablet; Take 1 tablet (100 mg total) by mouth daily as needed for erectile dysfunction.  Dispense: 10 tablet; Refill: 11  4. Nocturia DRE was done and it was noted to be normal size shape consistency of prostate without nodularity.  We will check PSA for current level. - PSA  5. Fatigue, unspecified type New onset of fatigue which may be age-related however given that he has had a history of elevated platelets and no recent TSH we will check CBC and TSH for current possibility of fatigue concern. - CBC with Differential/Platelet - TSH + free T4  6. Familial hypercholesterolemia Chronic.  Controlled.  Stable.  Is controlled with dietary control but.  I have reemphasized low-cholesterol low triglyceride dietary guidelines and provided these again.  We will check lipid panel for fasting state of LDL. - Lipid Panel With LDL/HDL Ratio  7. Elevated platelet count Slight elevation of platelets in the past and we will recheck with CBC - CBC with Differential/Platelet  8. Encounter for immunization Discussed with patient and this has been administered - Pneumococcal conjugate vaccine 20-valent  9. Screening for lung cancer Discussed with patient and this has been placed as referral as that patient at this time would like to proceed with screening.  Patient has been advised of the health risks of smoking and counseled concerning cessation of tobacco products. I spent over 3 minutes for discussion and to answer questions.  - Ambulatory Referral for Lung Cancer Screening [[id number]]    Alayne Allis, MD

## 2023-08-09 LAB — CBC WITH DIFFERENTIAL/PLATELET
Basophils Absolute: 0 10*3/uL (ref 0.0–0.2)
Basos: 1 %
EOS (ABSOLUTE): 0.2 10*3/uL (ref 0.0–0.4)
Eos: 2 %
Hematocrit: 47.9 % (ref 37.5–51.0)
Hemoglobin: 16.4 g/dL (ref 13.0–17.7)
Immature Grans (Abs): 0.1 10*3/uL (ref 0.0–0.1)
Immature Granulocytes: 1 %
Lymphocytes Absolute: 1.3 10*3/uL (ref 0.7–3.1)
Lymphs: 17 %
MCH: 33.2 pg — ABNORMAL HIGH (ref 26.6–33.0)
MCHC: 34.2 g/dL (ref 31.5–35.7)
MCV: 97 fL (ref 79–97)
Monocytes Absolute: 1.3 10*3/uL — ABNORMAL HIGH (ref 0.1–0.9)
Monocytes: 17 %
Neutrophils Absolute: 4.9 10*3/uL (ref 1.4–7.0)
Neutrophils: 62 %
Platelets: 435 10*3/uL (ref 150–450)
RBC: 4.94 x10E6/uL (ref 4.14–5.80)
RDW: 11.9 % (ref 11.6–15.4)
WBC: 7.8 10*3/uL (ref 3.4–10.8)

## 2023-08-09 LAB — TSH+FREE T4
Free T4: 1.48 ng/dL (ref 0.82–1.77)
TSH: 1.57 u[IU]/mL (ref 0.450–4.500)

## 2023-08-09 LAB — LIPID PANEL WITH LDL/HDL RATIO
Cholesterol, Total: 184 mg/dL (ref 100–199)
HDL: 66 mg/dL (ref >39)
LDL Chol Calc (NIH): 105 mg/dL — ABNORMAL HIGH (ref 0–99)
LDL/HDL Ratio: 1.6 ratio (ref 0.0–3.6)
Triglycerides: 68 mg/dL (ref 0–149)
VLDL Cholesterol Cal: 13 mg/dL (ref 5–40)

## 2023-08-09 LAB — COMPREHENSIVE METABOLIC PANEL WITH GFR
ALT: 19 IU/L (ref 0–44)
AST: 25 IU/L (ref 0–40)
Albumin: 4.9 g/dL (ref 3.9–4.9)
Alkaline Phosphatase: 49 IU/L (ref 44–121)
BUN/Creatinine Ratio: 9 — ABNORMAL LOW (ref 10–24)
BUN: 7 mg/dL — ABNORMAL LOW (ref 8–27)
Bilirubin Total: 0.5 mg/dL (ref 0.0–1.2)
CO2: 22 mmol/L (ref 20–29)
Calcium: 10 mg/dL (ref 8.6–10.2)
Chloride: 95 mmol/L — ABNORMAL LOW (ref 96–106)
Creatinine, Ser: 0.76 mg/dL (ref 0.76–1.27)
Globulin, Total: 2.1 g/dL (ref 1.5–4.5)
Glucose: 91 mg/dL (ref 70–99)
Potassium: 5.2 mmol/L (ref 3.5–5.2)
Sodium: 135 mmol/L (ref 134–144)
Total Protein: 7 g/dL (ref 6.0–8.5)
eGFR: 99 mL/min/{1.73_m2} (ref >59)

## 2023-08-09 LAB — PSA: Prostate Specific Ag, Serum: 3.2 ng/mL (ref 0.0–4.0)

## 2023-08-11 ENCOUNTER — Encounter: Payer: Self-pay | Admitting: Family Medicine

## 2023-08-12 NOTE — Telephone Encounter (Signed)
 Please review. JM

## 2023-08-13 DIAGNOSIS — G4733 Obstructive sleep apnea (adult) (pediatric): Secondary | ICD-10-CM | POA: Diagnosis not present

## 2023-09-23 ENCOUNTER — Ambulatory Visit
Admission: RE | Admit: 2023-09-23 | Discharge: 2023-09-23 | Disposition: A | Discharge status: Home / Self Care | Source: Ambulatory Visit | Attending: Acute Care | Admitting: Acute Care

## 2023-09-23 DIAGNOSIS — F17210 Nicotine dependence, cigarettes, uncomplicated: Secondary | ICD-10-CM | POA: Insufficient documentation

## 2023-09-23 DIAGNOSIS — Z87891 Personal history of nicotine dependence: Secondary | ICD-10-CM | POA: Insufficient documentation

## 2023-09-23 DIAGNOSIS — Z122 Encounter for screening for malignant neoplasm of respiratory organs: Secondary | ICD-10-CM | POA: Diagnosis not present

## 2023-10-10 ENCOUNTER — Other Ambulatory Visit: Payer: Self-pay

## 2023-10-10 DIAGNOSIS — Z87891 Personal history of nicotine dependence: Secondary | ICD-10-CM

## 2023-10-10 DIAGNOSIS — Z122 Encounter for screening for malignant neoplasm of respiratory organs: Secondary | ICD-10-CM

## 2023-10-10 DIAGNOSIS — F17210 Nicotine dependence, cigarettes, uncomplicated: Secondary | ICD-10-CM

## 2023-11-13 DIAGNOSIS — G4733 Obstructive sleep apnea (adult) (pediatric): Secondary | ICD-10-CM | POA: Diagnosis not present

## 2024-01-17 DIAGNOSIS — K219 Gastro-esophageal reflux disease without esophagitis: Secondary | ICD-10-CM | POA: Diagnosis not present

## 2024-01-17 DIAGNOSIS — N529 Male erectile dysfunction, unspecified: Secondary | ICD-10-CM | POA: Diagnosis not present

## 2024-01-17 DIAGNOSIS — Z720 Tobacco use: Secondary | ICD-10-CM | POA: Diagnosis not present

## 2024-01-17 DIAGNOSIS — Z1331 Encounter for screening for depression: Secondary | ICD-10-CM | POA: Diagnosis not present

## 2024-01-17 DIAGNOSIS — F320 Major depressive disorder, single episode, mild: Secondary | ICD-10-CM | POA: Diagnosis not present

## 2024-01-17 DIAGNOSIS — E785 Hyperlipidemia, unspecified: Secondary | ICD-10-CM | POA: Diagnosis not present

## 2024-01-17 DIAGNOSIS — R972 Elevated prostate specific antigen [PSA]: Secondary | ICD-10-CM | POA: Diagnosis not present

## 2024-01-17 DIAGNOSIS — I10 Essential (primary) hypertension: Secondary | ICD-10-CM | POA: Diagnosis not present

## 2024-01-20 DIAGNOSIS — R972 Elevated prostate specific antigen [PSA]: Secondary | ICD-10-CM | POA: Diagnosis not present

## 2024-01-20 DIAGNOSIS — E785 Hyperlipidemia, unspecified: Secondary | ICD-10-CM | POA: Diagnosis not present

## 2024-05-04 ENCOUNTER — Encounter: Payer: Self-pay | Admitting: Family Medicine

## 2024-05-04 ENCOUNTER — Ambulatory Visit: Admitting: Family Medicine

## 2024-05-04 VITALS — BP 120/66 | HR 93 | Ht 70.0 in | Wt 176.0 lb

## 2024-05-04 DIAGNOSIS — M5442 Lumbago with sciatica, left side: Secondary | ICD-10-CM

## 2024-05-04 DIAGNOSIS — G8929 Other chronic pain: Secondary | ICD-10-CM | POA: Diagnosis not present

## 2024-05-04 DIAGNOSIS — M47812 Spondylosis without myelopathy or radiculopathy, cervical region: Secondary | ICD-10-CM | POA: Insufficient documentation

## 2024-05-04 MED ORDER — TIZANIDINE HCL 4 MG PO TABS: 4.0000 mg | ORAL_TABLET | Freq: Three times a day (TID) | ORAL | 0 refills | Status: AC | PRN

## 2024-05-04 MED ORDER — PREDNISONE 20 MG PO TABS: 20.0000 mg | ORAL_TABLET | Freq: Two times a day (BID) | ORAL | 0 refills | Status: AC

## 2024-05-04 MED ORDER — DICLOFENAC SODIUM 50 MG PO TBEC: 50.0000 mg | DELAYED_RELEASE_TABLET | Freq: Two times a day (BID) | ORAL | 0 refills | Status: AC | PRN

## 2024-05-04 NOTE — Progress Notes (Signed)
 "    Primary Care / Sports Medicine Office Visit  Patient Information:  Patient ID: Jordan Morton, male DOB: 17-Jun-1955 Age: 69 y.o. MRN: [id number]   Jordan Morton is a pleasant 68 y.o. male presenting with the following:  Chief Complaint  Patient presents with   Neck Pain    Left side neck pain x 2+ weeks. Pain is sharp with constant ache.Patient gets numbness in left hand. Aggravating factors laying down on right side. NKI. Patient has taken Flexeril  and Aleve for pain which helps some.   Hip Pain    Left sided hip pain x 2 weeks. Pain going from left buttock all the way to left foot. Aggravating factor going from sit to stand. Patient hip also locks up occasionally. Patient has taken Flexeril  and Aleve for pain which helps some.     Vitals:   05/04/24 1015  BP: 120/66  Pulse: 93  SpO2: 99%   Vitals:   05/04/24 1015  Weight: 176 lb (79.8 kg)  Height: 5' 10 (1.778 m)   Body mass index is 25.25 kg/m.  No results found.   Discussed the use of AI scribe software for clinical note transcription with the patient, who gave verbal consent to proceed.   Independent interpretation of notes and tests performed by another provider:   None  Procedures performed:   None  Pertinent History, Exam, Impression, and Recommendations:   History of Present Illness Jordan Morton is a 69 year old male with cervical and lumbar spondylosis, left shoulder impingement, and recurrent left neck lipoma who presents with a two-week flare of neck, low back, and left hip pain.  Cervical Pain and Muscle Spasm: - Worsening left-sided neck pain for two weeks, radiating down the posterior neck - Associated with muscle tightness and spasm - Palpable knot in the left upper trapezius region, previously diagnosed as a lipoma and surgically excised - No routine pain directly over the lipoma - Neck pain exacerbated by certain sleeping positions, especially lying on the right side, which  causes pain to radiate down the neck - Neck crepitus present - Intermittent nocturnal paresthesia in the hands - No headache, upper extremity numbness, tingling, or weakness  Lumbar and Left Hip Pain with Radiculopathy: - Chronic low back and left hip pain, localized to the lateral hip and buttock - Pain radiates down the left leg to the plantar aspect of the foot, described as a hot knife sensation - Difficulty rising from seated positions - Episodes of hip locking with shooting pain down the leg - No persistent numbness or tingling, but occasional abnormal sensations in the left lower back - Pain sometimes aggravated by sleeping position - Stretching provides partial relief - Heating pad use has not been beneficial  Medication Use and Side Effects: - Cyclobenzaprine  10 mg in the morning and at night as needed for muscle spasm, with sedation as a side effect - Naproxen 220 mg every 12 hours - No recent use of meloxicam  or corticosteroids for musculoskeletal complaints  Left Neck Subcutaneous Lesion: - Recurrent, freely mobile, subcutaneous lesion in the left superior trapezius region, previously excised  Constitutional and Infectious Symptoms: - No respiratory symptoms - No symptoms following exposure to girlfriend's influenza A infection three weeks ago  Physical Exam  CERVICAL SPINE INSPECTION: Normal cervical alignment without deformity or swelling. PALPATION: Tenderness throughout the left upper trapezius. Palpable spherical subcutaneous lesion in the left superior trapezius region, freely mobile, 3-4 cm circular, consistent with likely lipoma. No midline  cervical tenderness. RANGE OF MOTION: Cervical extension and rotation limited by pain. Flexion preserved. NEUROLOGICAL: Upper extremity motor function intact; no focal deficit. SPECIAL TESTS: Spurlings test negative bilaterally.  LUMBOSACRAL SPINE INSPECTION: Normal lumbar alignment. PALPATION: Non-tender at the left  sacroiliac joint and left greater trochanter. No focal midline lumbar tenderness. RANGE OF MOTION: Lumbar motion preserved. NEUROLOGICAL: Lower extremity motor function intact. SPECIAL TESTS: Modified straight leg raise equivocal on the left. Kemps test positive on the left, localizing to the left lumbar region.  Results Labs BUN (08/08/2023): 7 Creatinine (08/08/2023): 0.76  Assessment and Plan Cervical spondylosis with muscle spasm Chronic cervical spondylosis with muscle spasm causing neck pain and nocturnal hand paresthesias. Imaging shows foraminal narrowing without acute nerve root compression. - Prescribed prednisone  5-day course, 2 tablets daily with food. - Instructed to discontinue naproxen during prednisone  course; may take one tablet if necessary, with caution for gastrointestinal upset. - Following prednisone , prescribed diclofenac  twice daily as needed for pain. - Prescribed tizanidine  as a muscle relaxant, up to three times daily as needed, with option to alternate with cyclobenzaprine  based on efficacy and sedation. - Advised to avoid driving or operating machinery for 8 hours post muscle relaxant dosing due to sedation risk. - Provided neck and spine conditioning exercises to initiate after medication course. - Recommended heat application as needed. - Discussed conditional cervical spine MRI or x-rays if symptoms persist into late February. - Advised follow-up if symptoms persist or worsen.  Lumbar spondylosis with radiculopathy Chronic lumbar spondylosis with intermittent left-sided radiculopathy causing hip and leg pain. Exam shows intermittent nerve root irritation without acute motor or sensory deficit. - Prescribed prednisone  5-day course, 2 tablets daily with food. - Following prednisone , prescribed diclofenac  twice daily as needed for pain. - Prescribed tizanidine  as a muscle relaxant, with option to alternate with cyclobenzaprine . - Provided spine conditioning  and stretching exercises (glute bridges, planks, core strengthening) to begin after medication course. - Recommended heat application as needed. - Discussed conditional lumbar spine/hip MRI or x-rays if symptoms persist into late February. - Advised follow-up if symptoms persist or worsen.  Impingement syndrome of left shoulder Left shoulder impingement syndrome; no acute complaints or findings addressed at this visit.  Lipoma of left neck Freely mobile subcutaneous lesion in left superior trapezius region consistent with lipoma. Asymptomatic and not the source of current neck pain. - Discussed referral to general surgery for evaluation and possible excision; he elected to defer as asymptomatic. - Recommended surgical evaluation if lipoma becomes symptomatic.  Problem List Items Addressed This Visit     Chronic left-sided low back pain with left-sided sciatica   Relevant Medications   predniSONE  (DELTASONE ) 20 MG tablet   diclofenac  (VOLTAREN ) 50 MG EC tablet   tiZANidine  (ZANAFLEX ) 4 MG tablet   Spondylosis of cervical region without myelopathy or radiculopathy - Primary   Relevant Medications   predniSONE  (DELTASONE ) 20 MG tablet   diclofenac  (VOLTAREN ) 50 MG EC tablet   tiZANidine  (ZANAFLEX ) 4 MG tablet     Orders & Medications Medications:  Meds ordered this encounter  Medications   predniSONE  (DELTASONE ) 20 MG tablet    Sig: Take 1 tablet (20 mg total) by mouth 2 (two) times daily with a meal for 5 days.    Dispense:  10 tablet    Refill:  0   diclofenac  (VOLTAREN ) 50 MG EC tablet    Sig: Take 1 tablet (50 mg total) by mouth 2 (two) times daily as needed.    Dispense:  60 tablet    Refill:  0   tiZANidine  (ZANAFLEX ) 4 MG tablet    Sig: Take 1 tablet (4 mg total) by mouth every 8 (eight) hours as needed for muscle spasms.    Dispense:  30 tablet    Refill:  0   No orders of the defined types were placed in this encounter.    No follow-ups on file.     Selinda JINNY Ku, MD, [facility]   Primary Care Sports Medicine Primary Care and Sports Medicine at [facility]   "

## 2024-05-04 NOTE — Patient Instructions (Signed)
 VISIT SUMMARY:  During your visit, we discussed your recent flare-up of neck, low back, and left hip pain. We reviewed your symptoms and adjusted your medications to help manage your pain and muscle spasms. We also provided exercises and recommendations for heat application to aid in your recovery.  YOUR PLAN:  CERVICAL SPONDYLOSIS WITH MUSCLE SPASM: Chronic neck condition causing pain and muscle spasms, with imaging showing foraminal narrowing. -Take prednisone  for 5 days, 2 tablets daily with food. -Stop taking naproxen during the prednisone  course; you may take one tablet if necessary, but be cautious of gastrointestinal upset. -After prednisone , take diclofenac  twice daily as needed for pain. -Use tizanidine  as a muscle relaxant up to three times daily as needed, and you can alternate with cyclobenzaprine  based on effectiveness and sedation. -Avoid driving or operating machinery for 8 hours after taking muscle relaxants due to sedation risk. -Start neck and spine conditioning exercises after completing the medication course. -Apply heat as needed. -Consider cervical spine MRI or x-rays if symptoms persist into late February. -Follow up if symptoms persist or worsen.  LUMBAR SPONDYLOSIS WITH RADICULOPATHY: Chronic lower back condition causing hip and leg pain, with intermittent nerve root irritation. -Take prednisone  for 5 days, 2 tablets daily with food. -After prednisone , take diclofenac  twice daily as needed for pain. -Use tizanidine  as a muscle relaxant, and you can alternate with cyclobenzaprine . -Start spine conditioning and stretching exercises (glute bridges, planks, core strengthening) after completing the medication course. -Apply heat as needed. -Consider lumbar spine/hip MRI or x-rays if symptoms persist into late February. -Follow up if symptoms persist or worsen.  IMPINGEMENT SYNDROME OF LEFT SHOULDER: Left shoulder condition with no acute complaints or findings at this  visit. -No specific treatment needed at this time.  LIPOMA OF LEFT NECK: Freely mobile subcutaneous lesion in the left superior trapezius region, consistent with a lipoma. -Discussed referral to general surgery for evaluation and possible excision; you elected to defer as it is asymptomatic. -Consider surgical evaluation if the lipoma becomes symptomatic.
